# Patient Record
Sex: Female | Born: 1975 | Race: White | Hispanic: No | Marital: Married | Smoking: Former smoker
Health system: Southern US, Community
[De-identification: ages and names within clinical notes are randomized; demographics above are authoritative.]

## PROBLEM LIST (undated history)

## (undated) DIAGNOSIS — O139 Gestational [pregnancy-induced] hypertension without significant proteinuria, unspecified trimester: Secondary | ICD-10-CM

## (undated) DIAGNOSIS — E669 Obesity, unspecified: Secondary | ICD-10-CM

## (undated) DIAGNOSIS — N281 Cyst of kidney, acquired: Secondary | ICD-10-CM

## (undated) DIAGNOSIS — N200 Calculus of kidney: Secondary | ICD-10-CM

## (undated) DIAGNOSIS — K8020 Calculus of gallbladder without cholecystitis without obstruction: Secondary | ICD-10-CM

## (undated) DIAGNOSIS — E282 Polycystic ovarian syndrome: Secondary | ICD-10-CM

## (undated) HISTORY — DX: Calculus of gallbladder without cholecystitis without obstruction: K80.20

## (undated) HISTORY — DX: Obesity, unspecified: E66.9

## (undated) HISTORY — DX: Cyst of kidney, acquired: N28.1

## (undated) HISTORY — DX: Polycystic ovarian syndrome: E28.2

## (undated) HISTORY — DX: Gestational (pregnancy-induced) hypertension without significant proteinuria, unspecified trimester: O13.9

## (undated) HISTORY — PX: WISDOM TOOTH EXTRACTION: SHX21

## (undated) HISTORY — DX: Calculus of kidney: N20.0

---

## 2000-01-03 HISTORY — PX: INCISION AND DRAINAGE PERIRECTAL ABSCESS: SHX1804

## 2006-01-14 ENCOUNTER — Emergency Department: Payer: Self-pay | Admitting: Emergency Medicine

## 2007-12-06 ENCOUNTER — Observation Stay: Payer: Self-pay

## 2008-01-08 ENCOUNTER — Inpatient Hospital Stay: Payer: Self-pay | Admitting: Obstetrics & Gynecology

## 2008-08-27 ENCOUNTER — Ambulatory Visit: Payer: Self-pay | Admitting: Family Medicine

## 2008-08-27 DIAGNOSIS — J45909 Unspecified asthma, uncomplicated: Secondary | ICD-10-CM | POA: Insufficient documentation

## 2008-08-27 DIAGNOSIS — IMO0002 Reserved for concepts with insufficient information to code with codable children: Secondary | ICD-10-CM | POA: Insufficient documentation

## 2008-08-27 DIAGNOSIS — A630 Anogenital (venereal) warts: Secondary | ICD-10-CM | POA: Insufficient documentation

## 2008-09-02 LAB — CONVERTED CEMR LAB
AST: 29 units/L (ref 0–37)
Albumin: 4.3 g/dL (ref 3.5–5.2)
Alkaline Phosphatase: 58 units/L (ref 39–117)
CO2: 26 meq/L (ref 19–32)
FSH: 8 milliintl units/mL
Glucose, Bld: 86 mg/dL (ref 70–99)
HDL: 41.2 mg/dL (ref >39.00)
Potassium: 3.9 meq/L (ref 3.5–5.1)
Sodium: 138 meq/L (ref 135–145)
Total CHOL/HDL Ratio: 5
Total Protein: 7.8 g/dL (ref 6.0–8.3)

## 2008-09-21 ENCOUNTER — Ambulatory Visit: Payer: Self-pay | Admitting: Family Medicine

## 2008-09-21 DIAGNOSIS — J020 Streptococcal pharyngitis: Secondary | ICD-10-CM | POA: Insufficient documentation

## 2008-09-21 LAB — CONVERTED CEMR LAB: Rapid Strep: POSITIVE

## 2008-09-25 ENCOUNTER — Ambulatory Visit: Payer: Self-pay | Admitting: Family Medicine

## 2008-09-25 ENCOUNTER — Telehealth (INDEPENDENT_AMBULATORY_CARE_PROVIDER_SITE_OTHER): Payer: Self-pay | Admitting: *Deleted

## 2008-10-28 ENCOUNTER — Encounter: Payer: Self-pay | Admitting: Family Medicine

## 2009-02-01 ENCOUNTER — Telehealth (INDEPENDENT_AMBULATORY_CARE_PROVIDER_SITE_OTHER): Payer: Self-pay | Admitting: *Deleted

## 2010-02-01 NOTE — Progress Notes (Signed)
Summary: Appt rescheduled  Phone Note Outgoing Call   Call placed by: Daine Gip,  February 01, 2009 9:31 AM Call placed to: Patient Summary of Call: Called pt to reschedule appt, because of the volume of pts on Dr. Dayton Martes scheduled.  The pt became upset that we made her appt and cancelled. told pt I wanted to reschedule her appt. Pt did not want to discuss, says she would transfer her medical care.Daine Gip  February 01, 2009 9:37 AM fyi to [place].Daine Gip  February 01, 2009 9:37 AM  Initial call taken by: Daine Gip,  February 01, 2009 9:37 AM

## 2011-03-23 ENCOUNTER — Ambulatory Visit (INDEPENDENT_AMBULATORY_CARE_PROVIDER_SITE_OTHER): Payer: 59 | Admitting: Family Medicine

## 2011-03-23 ENCOUNTER — Encounter: Payer: Self-pay | Admitting: Family Medicine

## 2011-03-23 VITALS — BP 130/82 | HR 72 | Ht 61.0 in | Wt 258.0 lb

## 2011-03-23 DIAGNOSIS — J45909 Unspecified asthma, uncomplicated: Secondary | ICD-10-CM

## 2011-03-23 DIAGNOSIS — Z0000 Encounter for general adult medical examination without abnormal findings: Secondary | ICD-10-CM

## 2011-03-23 DIAGNOSIS — Z23 Encounter for immunization: Secondary | ICD-10-CM

## 2011-03-23 DIAGNOSIS — J309 Allergic rhinitis, unspecified: Secondary | ICD-10-CM | POA: Insufficient documentation

## 2011-03-23 DIAGNOSIS — J45998 Other asthma: Secondary | ICD-10-CM | POA: Insufficient documentation

## 2011-03-23 LAB — POCT URINALYSIS DIPSTICK
Ketones, UA: NEGATIVE
Leukocytes, UA: NEGATIVE
Protein, UA: NEGATIVE
pH, UA: 5

## 2011-03-23 NOTE — Progress Notes (Signed)
Connie Evans is a 36 y.o. female who presents for a complete physical.  She has the following concerns:  Due for pap, but on menses now, heavy. Wants to discuss her Advair that she was given after 2 episodes of bronchitis, feels like if she doesn't use it she is having trouble breathing. Also has had increased weight gain over the past month. Not getting any exercise.  Weight is unchanged from last visit in 2010, but reports she has gained weight in the last couple of weeks.  Asthma--diagnosed age 68, but really only had an episode after moving to [place].  Has been having more problems over the last 2 years.  Was sick in January, treated with 2 courses of antibiotics and steroids.  Has been on Advair ever since.  Takes it just once a day, but if she misses it, she has recurrent shortness of breath.  Has albuterol inhaler, but hasn't needed it.  Immunization History  Administered Date(s) Administered  . Influenza Split 10/03/2010  . Tdap 11/12/2008   Last Pap smear: postpartum check in 2010 Last mammogram: never Last colonoscopy: never Last DEXA: never Dentist: past due, 2009 Ophtho: recent Exercise: none Lipids 08/2008 TChol 194, LDL 129, HDL 41, TG 117, chol/HDL ratio 5.  Past Medical History  Diagnosis Date  . Asthma     1 episode age 44 when moved to [place], but problems since 2010  . Obesity   . PIH (pregnancy induced hypertension)     Past Surgical History  Procedure Date  . Cesarean section 2010  . Incision and drainage perirectal abscess 2002    in [place]  . Wisdom tooth extraction     History   Social History  . Marital Status: Married    Spouse Name: N/A    Number of Children: 1  . Years of Education: N/A   Occupational History  . medical billing    Social History Main Topics  . Smoking status: Former Smoker    Quit date: 07/18/2007  . Smokeless tobacco: Never Used  . Alcohol Use: No  . Drug Use: No  . Sexually Active: Yes -- Female partner(s)      Birth Control/ Protection: None   Other Topics Concern  . Not on file   Social History Narrative   Lives with husband, daughter, dog.  Husband is a diabetic    Family History  Problem Relation Age of Onset  . Cancer Mother 88    breast cancer  . Hypertension Father   . Heart disease Neg Hx   . Stroke Neg Hx   . Diabetes Neg Hx     Current outpatient prescriptions:Fluticasone-Salmeterol (ADVAIR) 250-50 MCG/DOSE AEPB, Inhale 1 puff into the lungs every 12 (twelve) hours. Only using once daily in the mornings., Disp: , Rfl: ;  PROVENTIL HFA 108 (90 BASE) MCG/ACT inhaler, Inhale 2 puffs into the lungs every 4 (four) hours as needed. , Disp: , Rfl:   No Known Allergies  ROS:  The patient denies anorexia, fever, headaches, decreased hearing, ear pain, sore throat, breast concerns, chest pain, palpitations, dizziness, syncope, cough, swelling, nausea, vomiting, diarrhea, constipation, abdominal pain, melena, hematochezia, indigestion/heartburn, hematuria, incontinence, dysuria, irregular menstrual cycles (has been irregular in the past, every 2 months, more regular recently), vaginal discharge, odor or itch, genital lesions, joint pains, numbness, tingling, weakness, tremor, suspicious skin lesions, depression, anxiety, abnormal bleeding/bruising, or enlarged lymph nodes. Rare heartburn, only with certain foods.  Sinuses have been bothering her lately.  Hasn't taken  anything for them.  PHYSICAL EXAM: BP 130/82  Pulse 72  Ht 5\' 1"  (1.549 m)  Wt 258 lb (117.028 kg)  BMI 48.75 kg/m2  LMP 03/13/2011  General Appearance:    Alert, cooperative, no distress, appears stated age  Head:    Normocephalic, without obvious abnormality, atraumatic  Eyes:    PERRL, conjunctiva/corneas clear, EOM's intact, fundi    benign  Ears:    Normal TM's and external ear canals  Nose:   Nares normal, mucosa normal, no drainage or sinus   tenderness  Throat:   Lips, mucosa, and tongue normal; teeth and gums  normal  Neck:   Supple, no lymphadenopathy;  thyroid:  no   enlargement/tenderness/nodules; no carotid   bruit or JVD  Back:    Spine nontender, no curvature, ROM normal, no CVA     tenderness  Lungs:     Clear to auscultation bilaterally without wheezes, rales or     ronchi; respirations unlabored  Chest Wall:    No tenderness or deformity   Heart:    Regular rate and rhythm, S1 and S2 normal, no murmur, rub   or gallop  Breast Exam:    Deferred--to be done with GYN exam when not on her menstrual cycle  Abdomen:     Soft, non-tender, nondistended, normoactive bowel sounds,    no masses, no hepatosplenomegaly  Genitalia:   Deferred due to menses  Rectal:    Not performed due to age<40 and no related complaints  Extremities:   No clubbing, cyanosis or edema  Pulses:   2+ and symmetric all extremities  Skin:   Skin color, texture, turgor normal, no rashes or lesions. Benign appearing nevi  Lymph nodes:   Cervical, supraclavicular, and axillary nodes normal  Neurologic:   CNII-XII intact, normal strength, sensation and gait; reflexes 2+ and symmetric throughout          Psych:   Normal mood, affect, hygiene and grooming.     ASSESSMENT/PLAN:  1. Routine general medical examination at a health care facility  POCT Urinalysis Dipstick  2. Need for pneumococcal vaccination  Pneumococcal polysaccharide vaccine 23-valent greater than or equal to 2yo subcutaneous/IM  3. Allergic rhinitis, cause unspecified    4. Asthma in remission     Allergies--start antihistamine such as claritin or Zyrtec. If allergies or asthma is not well controlled with this regimen, consider adding Singulair  Asthma--continue Advair daily.  Increase to twice daily if having any recurrent shortness of breath or wheezing, or with any illness that contributes to this.  Use albuterol as needed as a rescue inhaler, and call if/when refills are needed. Pneumovax today, flu shots yearly.  Borderline lipids in past, with low  HDL and high ratio.  LDL okay/borderline.  Encouraged low cholesterol, low fat diet, daily exercise and fish oil capsules 3000 mg daily.  Obesity--discussed need for exercise, portion control healthy diet, avoidance of empty calories (ie soda, juice).   Discussed monthly self breast exams and yearly mammograms after the age of 40--baseline recommended now due to family history in her mother; at least 30 minutes of aerobic activity at least 5 days/week; proper sunscreen use reviewed; healthy diet, including goals of calcium and vitamin D intake and alcohol recommendations (less than or equal to 1 drink/day) reviewed; regular seatbelt use; changing batteries in smoke detectors.  Immunization recommendations discussed--pneumovax today.  Colonoscopy recommendations reviewed--age 38  Return in 1-2 weeks for breast/pelvic exam. Patient not fasting today.  Recommend waiting for  labs for 2-3 months of improved diet and exercise.  Plan lipids, glucose, tsh  Recommended use of prenatal vitamin, as she is not currently using contraception.

## 2011-03-23 NOTE — Patient Instructions (Addendum)
HEALTH MAINTENANCE RECOMMENDATIONS:  It is recommended that you get at least 30 minutes of aerobic exercise at least 5 days/week (for weight loss, you may need as much as 60-90 minutes). This can be any activity that gets your heart rate up. This can be divided in 10-15 minute intervals if needed, but try and build up your endurance at least once a week.  Weight bearing exercise is also recommended twice weekly.  Eat a healthy diet with lots of vegetables, fruits and fiber.  "Colorful" foods have a lot of vitamins (ie green vegetables, tomatoes, red peppers, etc).  Limit sweet tea, regular sodas and alcoholic beverages, all of which has a lot of calories and sugar.  Up to 1 alcoholic drink daily may be beneficial for women (unless trying to lose weight, watch sugars).  Drink a lot of water.  Calcium recommendations are 1200-1500 mg daily (1500 mg for postmenopausal women or women without ovaries), and vitamin D 1000 IU daily.  This should be obtained from diet and/or supplements (vitamins), and calcium should not be taken all at once, but in divided doses.  Monthly self breast exams and yearly mammograms for women over the age of 67 is recommended.  Sunscreen of at least SPF 30 should be used on all sun-exposed parts of the skin when outside between the hours of 10 am and 4 pm (not just when at beach or pool, but even with exercise, golf, tennis, and yard work!)  Use a sunscreen that says "broad spectrum" so it covers both UVA and UVB rays, and make sure to reapply every 1-2 hours.  Remember to change the batteries in your smoke detectors when changing your clock times in the spring and fall.  Use your seat belt every time you are in a car, and please drive safely and not be distracted with cell phones and texting while driving.  Allergies--start antihistamine such as claritin or Zyrtec.  If allergies or asthma is not well controlled with this regimen, consider adding Singulair  Asthma--continue  Advair daily.  Increase to twice daily if having any recurrent shortness of breath or wheezing, or with any illness that contributes to this.  Use albuterol as needed as a rescue inhaler, and call if/when refills are needed. Pneumovax today.  Borderline lipids in past, with low HDL and high ratio.  LDL okay/borderline.  Encouraged low cholesterol, low fat diet, daily exercise and fish oil capsules 3000 mg daily.  Please schedule dentist appointment.  We will plan on checking your sugar and cholesterol in the next 3-4 months.  We will set that up at your next visit.  Prenatal vitamin recommended

## 2011-03-30 ENCOUNTER — Encounter: Payer: Self-pay | Admitting: Internal Medicine

## 2011-04-06 ENCOUNTER — Ambulatory Visit (INDEPENDENT_AMBULATORY_CARE_PROVIDER_SITE_OTHER): Payer: 59 | Admitting: Family Medicine

## 2011-04-06 ENCOUNTER — Other Ambulatory Visit (HOSPITAL_COMMUNITY)
Admission: RE | Admit: 2011-04-06 | Discharge: 2011-04-06 | Disposition: A | Discharge status: Home / Self Care | Payer: 59 | Source: Ambulatory Visit | Attending: Family Medicine | Admitting: Family Medicine

## 2011-04-06 ENCOUNTER — Encounter: Payer: Self-pay | Admitting: Family Medicine

## 2011-04-06 VITALS — BP 124/84 | HR 68 | Ht 61.0 in | Wt 262.0 lb

## 2011-04-06 DIAGNOSIS — Z01419 Encounter for gynecological examination (general) (routine) without abnormal findings: Secondary | ICD-10-CM | POA: Insufficient documentation

## 2011-04-06 DIAGNOSIS — R5383 Other fatigue: Secondary | ICD-10-CM

## 2011-04-06 DIAGNOSIS — Z13220 Encounter for screening for lipoid disorders: Secondary | ICD-10-CM

## 2011-04-06 DIAGNOSIS — R5381 Other malaise: Secondary | ICD-10-CM

## 2011-04-06 NOTE — Patient Instructions (Signed)
Remember to start prenatal vitamin daily Remember to get 1200 mg of calcium and 1000 IU of Vitamin D daily, either through vitamins/supplements of diet.  Remember to schedule your baseline mammogram  Schedule your labs for 3 months.  Try and get on a healthier routine as far as diet and exercise by then.

## 2011-04-06 NOTE — Progress Notes (Signed)
Patient presents for breast and pelvic exam.  Denies any specific complaints or concerns.  Period lasted longer than usual this past time. Denies vaginal discharge, odor, itch or risks for STD's.  In a monogamous relationship with her husband.  Hasn't started prenatal vitamins yet; allergies and asthma have been doing well.  Past Medical History  Diagnosis Date  . Asthma     1 episode age 36 when moved to [place], but problems since 2010  . Obesity   . PIH (pregnancy induced hypertension)     Past Surgical History  Procedure Date  . Cesarean section 2010  . Incision and drainage perirectal abscess 2002    in [place]  . Wisdom tooth extraction     History   Social History  . Marital Status: Married    Spouse Name: N/A    Number of Children: 1  . Years of Education: N/A   Occupational History  . medical billing    Social History Main Topics  . Smoking status: Former Smoker    Quit date: 07/18/2007  . Smokeless tobacco: Never Used  . Alcohol Use: No  . Drug Use: No  . Sexually Active: Yes -- Female partner(s)    Birth Control/ Protection: None   Other Topics Concern  . Not on file   Social History Narrative   Lives with husband, daughter, dog.  Husband is a diabetic    Family History  Problem Relation Age of Onset  . Cancer Mother 72    breast cancer  . Hypertension Father   . Heart disease Neg Hx   . Stroke Neg Hx   . Diabetes Neg Hx     Current outpatient prescriptions:Fluticasone-Salmeterol (ADVAIR) 250-50 MCG/DOSE AEPB, Inhale 1 puff into the lungs every 12 (twelve) hours. Only using once daily in the mornings., Disp: , Rfl: ;  PROVENTIL HFA 108 (90 BASE) MCG/ACT inhaler, Inhale 2 puffs into the lungs every 4 (four) hours as needed. , Disp: , Rfl:   No Known Allergies  ROS:  Denies fevers, URI symptoms.  Allergies and asthma doing well.  Denies urinary complaints, vaginal discharge, skin rashes or other concerns.  BP 124/84  Pulse 68  Ht 5\' 1"  (1.549  m)  Wt 262 lb (118.842 kg)  BMI 49.50 kg/m2  LMP 03/13/2011 Well developed, morbidly obese female in no distress Breast exam: no skin dimpling, nipple discharge, nipple inversion, breast masses or axillary lymphadenopathy GYN exam:  External genitalia normal without lesions.  BUS and vagina normal, some clear-white discharge present, without odor.  Cervix is normal without lesions.  No cervical motion tenderness.  No adnexal masses or uterine masses, although exam is somewhat limited by body habitus.  Pap was obtained.  ASSESSMENT/PLAN: 1. Fatigue  TSH, Vitamin D 25 hydroxy, Comprehensive metabolic panel, CBC with Differential  2. Screening for lipoid disorders  Lipid panel  3. Well female exam with routine gynecological exam  Cytology - PAP   Recommended use of prenatal vitamin, as she is not currently using contraception.  Schedule mammogram as previously recommended  Return for fasting labs in 3 months, when on better died, and getting more regular exercise as recommended.

## 2011-04-10 ENCOUNTER — Encounter: Payer: Self-pay | Admitting: Family Medicine

## 2011-07-03 ENCOUNTER — Other Ambulatory Visit: Payer: 59

## 2011-07-03 DIAGNOSIS — R5383 Other fatigue: Secondary | ICD-10-CM

## 2011-07-03 DIAGNOSIS — Z13220 Encounter for screening for lipoid disorders: Secondary | ICD-10-CM

## 2011-07-03 LAB — CBC WITH DIFFERENTIAL/PLATELET
Eosinophils Absolute: 0.2 10*3/uL (ref 0.0–0.7)
Hemoglobin: 13.8 g/dL (ref 12.0–15.0)
Lymphocytes Relative: 32 % (ref 12–46)
Lymphs Abs: 1.8 10*3/uL (ref 0.7–4.0)
Monocytes Relative: 8 % (ref 3–12)
Neutro Abs: 3.1 10*3/uL (ref 1.7–7.7)
Neutrophils Relative %: 55 % (ref 43–77)
Platelets: 287 10*3/uL (ref 150–400)
RBC: 4.77 MIL/uL (ref 3.87–5.11)
WBC: 5.7 10*3/uL (ref 4.0–10.5)

## 2011-07-04 ENCOUNTER — Encounter: Payer: Self-pay | Admitting: Family Medicine

## 2011-07-04 DIAGNOSIS — E559 Vitamin D deficiency, unspecified: Secondary | ICD-10-CM | POA: Insufficient documentation

## 2011-07-04 LAB — LIPID PANEL
Cholesterol: 195 mg/dL (ref 0–200)
HDL: 49 mg/dL (ref >39)
LDL Cholesterol: 125 mg/dL — ABNORMAL HIGH (ref 0–99)
Triglycerides: 107 mg/dL (ref <150)

## 2011-07-04 LAB — COMPREHENSIVE METABOLIC PANEL
Albumin: 4.3 g/dL (ref 3.5–5.2)
Alkaline Phosphatase: 62 U/L (ref 39–117)
BUN: 10 mg/dL (ref 6–23)
CO2: 24 mEq/L (ref 19–32)
Calcium: 8.9 mg/dL (ref 8.4–10.5)
Chloride: 103 mEq/L (ref 96–112)
Glucose, Bld: 96 mg/dL (ref 70–99)
Potassium: 4.2 mEq/L (ref 3.5–5.3)
Sodium: 140 mEq/L (ref 135–145)
Total Protein: 6.9 g/dL (ref 6.0–8.3)

## 2011-07-05 ENCOUNTER — Other Ambulatory Visit: Payer: Self-pay | Admitting: *Deleted

## 2011-07-05 DIAGNOSIS — E559 Vitamin D deficiency, unspecified: Secondary | ICD-10-CM

## 2011-07-05 MED ORDER — ERGOCALCIFEROL 1.25 MG (50000 UT) PO CAPS: 50000.0000 [IU] | ORAL_CAPSULE | ORAL | Status: DC

## 2012-03-15 ENCOUNTER — Ambulatory Visit: Payer: 59 | Admitting: Family Medicine

## 2012-03-15 ENCOUNTER — Encounter: Payer: Self-pay | Admitting: Family Medicine

## 2012-03-15 VITALS — BP 122/88 | HR 93 | Temp 98.2°F | Wt 288.0 lb

## 2012-03-15 DIAGNOSIS — J019 Acute sinusitis, unspecified: Secondary | ICD-10-CM

## 2012-03-15 MED ORDER — AMOXICILLIN 875 MG PO TABS: 875.0000 mg | ORAL_TABLET | Freq: Two times a day (BID) | ORAL | Status: DC

## 2012-03-15 NOTE — Patient Instructions (Signed)

## 2012-03-15 NOTE — Progress Notes (Signed)
  Subjective:    Patient ID: Connie Evans, female    DOB: [birth year], 37 y.o.   MRN: [id number]  HPI She has a ten-day history of sinus congestion, rhinorrhea and PND. She then developed a cough that has become productive. She also notes upper tooth discomfort with the PND becoming more purulent. She notes a popping sensation in her right ear. No fever or chills. She does not smoke and has no allergies.   Review of Systems     Objective:   Physical Exam alert and in no distress. Tympanic membranes and canals are normal. Throat is clear. Tonsils are normal. Neck is supple without adenopathy or thyromegaly. Cardiac exam shows a regular sinus rhythm without murmurs or gallops. Lungs are clear to auscultation. Nasal mucosa is slightly erythematous. Tender to palpation over frontal and maxillary sinuses       Assessment & Plan:  Acute sinusitis - Plan: amoxicillin (AMOXIL) 875 MG tablet  She is to call if not entirely better when she finishes the antibiotic

## 2012-03-21 ENCOUNTER — Telehealth: Payer: Self-pay | Admitting: Family Medicine

## 2012-03-21 NOTE — Telephone Encounter (Signed)
If she's using the inhaler and not getting good response, have her set up an appointment with [facility] or with me next week.

## 2012-03-22 NOTE — Telephone Encounter (Signed)
I left a message on the patients voicemail. CLS 

## 2012-03-28 ENCOUNTER — Encounter: Payer: Self-pay | Admitting: Family Medicine

## 2012-03-28 ENCOUNTER — Ambulatory Visit (INDEPENDENT_AMBULATORY_CARE_PROVIDER_SITE_OTHER): Payer: 59 | Admitting: Family Medicine

## 2012-03-28 VITALS — BP 130/84 | HR 84 | Temp 97.6°F | Ht 61.0 in | Wt 290.0 lb

## 2012-03-28 DIAGNOSIS — J45901 Unspecified asthma with (acute) exacerbation: Secondary | ICD-10-CM

## 2012-03-28 DIAGNOSIS — R059 Cough, unspecified: Secondary | ICD-10-CM

## 2012-03-28 DIAGNOSIS — R05 Cough: Secondary | ICD-10-CM

## 2012-03-28 DIAGNOSIS — J209 Acute bronchitis, unspecified: Secondary | ICD-10-CM

## 2012-03-28 MED ORDER — MOMETASONE FURO-FORMOTEROL FUM 200-5 MCG/ACT IN AERO: 2.0000 | INHALATION_SPRAY | Freq: Two times a day (BID) | RESPIRATORY_TRACT | Status: DC

## 2012-03-28 MED ORDER — SODIUM CHLORIDE 0.9 % IV SOLN
125.0000 mg | Freq: Once | INTRAVENOUS | Status: AC
  Administered 2012-03-28: 130 mg via INTRAMUSCULAR

## 2012-03-28 MED ORDER — AZITHROMYCIN 250 MG PO TABS: ORAL_TABLET | ORAL | Status: DC

## 2012-03-28 MED ORDER — PREDNISONE 20 MG PO TABS: 20.0000 mg | ORAL_TABLET | Freq: Two times a day (BID) | ORAL | Status: DC

## 2012-03-28 MED ORDER — ALBUTEROL SULFATE HFA 108 (90 BASE) MCG/ACT IN AERS: 2.0000 | INHALATION_SPRAY | RESPIRATORY_TRACT | Status: DC | PRN

## 2012-03-28 NOTE — Progress Notes (Signed)
Chief Complaint  Patient presents with  . Cough    seen last week by Dr.Lalonde for sinus infection, given amoxil 875, finished abx. Never really got better just moved into her chest, cough is really bad now and mucus is white/green.   Saw Dr. Susann Givens about 2 weeks ago for sinus infection.  Treated with amoxacillin and the sinuses improved.  Now complaining of chest congestion, tightness, shortness of breath and wheezing.  Started with these symptoms about a week ago, and is getting worse.  Having shortness of breath with minimal exertion.  Using albuterol every 3.5-4 hours, and not working very well.  Cough is productive of creamy, slightly yellow phlegm, with some green chunks mixed in (green started yesterday).  She hasn't been using any OTC meds.  She got hives from a liquid mucinex in February, has tolerated Robitussin in past.  Has been off of her Advair for almost a year, because had been doing well, not needing inhaler.  Recently started it back up, but only had a few doses.  Past Medical History  Diagnosis Date  . Asthma     1 episode age 37 when moved to [place], but problems since 2010  . Obesity   . PIH (pregnancy induced hypertension)    Past Surgical History  Procedure Laterality Date  . Cesarean section  2010  . Incision and drainage perirectal abscess  2002    in [place]  . Wisdom tooth extraction     History   Social History  . Marital Status: Married    Spouse Name: N/A    Number of Children: 1  . Years of Education: N/A   Occupational History  . medical billing    Social History Main Topics  . Smoking status: Former Smoker    Quit date: 07/18/2007  . Smokeless tobacco: Never Used  . Alcohol Use: No  . Drug Use: No  . Sexually Active: Yes -- Female partner(s)    Birth Control/ Protection: None   Other Topics Concern  . Not on file   Social History Narrative   Lives with husband, daughter, dog.  Husband is a diabetic    Current outpatient  prescriptions:Multiple Vitamins-Minerals (MULTIVITAMIN WITH MINERALS) tablet, Take 1 tablet by mouth daily., Disp: , Rfl: ;  PROVENTIL HFA 108 (90 BASE) MCG/ACT inhaler, Inhale 2 puffs into the lungs every 4 (four) hours as needed. , Disp: , Rfl: ;  Fluticasone-Salmeterol (ADVAIR) 250-50 MCG/DOSE AEPB, Inhale 1 puff into the lungs every 12 (twelve) hours. Only using once daily in the mornings., Disp: , Rfl:  ibuprofen (ADVIL,MOTRIN) 200 MG tablet, Take 400 mg by mouth every 6 (six) hours as needed for pain., Disp: , Rfl:    No Known Allergies  ROS:  Denies fever, allergy symptoms--no runny nose, itchy eyes.  Denies nausea, vomiting.  Had some diarrhea from recent antibiotics, resolved. Denies skin rashes.  Periods have been irregular.  +weight gain (about 30 pounds since 1 year ago)  PHYSICAL EXAM: BP 130/84  Pulse 84  Temp(Src) 97.6 F (36.4 C) (Oral)  Ht 5\' 1"  (1.549 m)  Wt 290 lb (131.543 kg)  BMI 54.82 kg/m2  LMP 01/17/2012 96% O2 sat prior to neb. Pleasant obese female in no distress.  Speaking in full sentences, but then having some pursed lip breathing intermittently.  +tachypnea after minimal exertion (getting on exam table) HEENT:  Conjunctiva clear.  TM's and EAC's normal. Nasal mucosa moderately edematous, no purulence.  OP clear Neck: no lymphadenopathy Heart:  regular rate, rhythm Lungs:  Diffuse wheezing with prolonged expiration.  Diminished air movement throughout. Skin: no rash  Treated with albuterol/atrovent nebulizer.  After treatment: pt felt much better.  Air movement significantly improved, but still diminished. some mild wheezing persists throughout.  No rales or ronchi.  ASSESSMENT/PLAN: Asthma with acute exacerbation - Plan: mometasone-formoterol (DULERA) 200-5 MCG/ACT AERO, albuterol (PROVENTIL HFA) 108 (90 BASE) MCG/ACT inhaler, predniSONE (DELTASONE) 20 MG tablet  Cough - Plan: PR INHAL RX, AIRWAY OBST/DX SPUTUM INDUCT, PR ALBUTEROL IPRATROP NON-COMP,  methylPREDNISolone sodium succinate (SOLU-MEDROL) 130 mg in sodium chloride 0.9 % 50 mL IVPB  Acute bronchitis - Plan: azithromycin (ZITHROMAX) 250 MG tablet  Risks/side effects of steroids, antibiotics reviewed.  Proper use of inhaled steroids reviewed, and risks/side effects reviewed, reminded to rinse mouth after use.  Solumedrol 125 given in office. rx prednisone 20mg  BID x 5 days, start tomorrow. Restart Advair today, BID---changed to [facility] due to insurance formulary Continue albuterol as needed z-pak to cover bronchitis, given ongoing discolored phlegm and cough  F/u 1 week prn. Schedule CPE

## 2012-03-28 NOTE — Patient Instructions (Signed)
Start the prednisone tomorrow. Start the [facility] today Follow up next week if not improving

## 2012-08-07 ENCOUNTER — Ambulatory Visit (INDEPENDENT_AMBULATORY_CARE_PROVIDER_SITE_OTHER): Payer: 59 | Admitting: Family Medicine

## 2012-08-07 ENCOUNTER — Encounter: Payer: Self-pay | Admitting: Family Medicine

## 2012-08-07 VITALS — BP 130/86 | HR 72 | Ht 61.0 in | Wt 291.0 lb

## 2012-08-07 DIAGNOSIS — J45998 Other asthma: Secondary | ICD-10-CM

## 2012-08-07 DIAGNOSIS — J309 Allergic rhinitis, unspecified: Secondary | ICD-10-CM

## 2012-08-07 DIAGNOSIS — R0683 Snoring: Secondary | ICD-10-CM

## 2012-08-07 DIAGNOSIS — Z0000 Encounter for general adult medical examination without abnormal findings: Secondary | ICD-10-CM

## 2012-08-07 DIAGNOSIS — R400 Somnolence: Secondary | ICD-10-CM

## 2012-08-07 DIAGNOSIS — R319 Hematuria, unspecified: Secondary | ICD-10-CM

## 2012-08-07 DIAGNOSIS — R635 Abnormal weight gain: Secondary | ICD-10-CM

## 2012-08-07 DIAGNOSIS — R0989 Other specified symptoms and signs involving the circulatory and respiratory systems: Secondary | ICD-10-CM

## 2012-08-07 DIAGNOSIS — J45909 Unspecified asthma, uncomplicated: Secondary | ICD-10-CM

## 2012-08-07 DIAGNOSIS — N926 Irregular menstruation, unspecified: Secondary | ICD-10-CM | POA: Insufficient documentation

## 2012-08-07 DIAGNOSIS — E559 Vitamin D deficiency, unspecified: Secondary | ICD-10-CM

## 2012-08-07 DIAGNOSIS — G4710 Hypersomnia, unspecified: Secondary | ICD-10-CM

## 2012-08-07 LAB — POCT URINALYSIS DIPSTICK
Bilirubin, UA: NEGATIVE
Glucose, UA: NEGATIVE
Spec Grav, UA: 1.02
Urobilinogen, UA: NEGATIVE

## 2012-08-07 LAB — POCT URINE PREGNANCY: Preg Test, Ur: NEGATIVE

## 2012-08-07 NOTE — Patient Instructions (Addendum)
HEALTH MAINTENANCE RECOMMENDATIONS:  It is recommended that you get at least 30 minutes of aerobic exercise at least 5 days/week (for weight loss, you may need as much as 60-90 minutes). This can be any activity that gets your heart rate up. This can be divided in 10-15 minute intervals if needed, but try and build up your endurance at least once a week.  Weight bearing exercise is also recommended twice weekly.  Eat a healthy diet with lots of vegetables, fruits and fiber.  "Colorful" foods have a lot of vitamins (ie green vegetables, tomatoes, red peppers, etc).  Limit sweet tea, regular sodas and alcoholic beverages, all of which has a lot of calories and sugar.  Up to 1 alcoholic drink daily may be beneficial for women (unless trying to lose weight, watch sugars).  Drink a lot of water.  Calcium recommendations are 1200-1500 mg daily (1500 mg for postmenopausal women or women without ovaries), and vitamin D 1000 IU daily.  This should be obtained from diet and/or supplements (vitamins), and calcium should not be taken all at once, but in divided doses.  Monthly self breast exams and yearly mammograms for women over the age of 2 is recommended.  Sunscreen of at least SPF 30 should be used on all sun-exposed parts of the skin when outside between the hours of 10 am and 4 pm (not just when at beach or pool, but even with exercise, golf, tennis, and yard work!)  Use a sunscreen that says "broad spectrum" so it covers both UVA and UVB rays, and make sure to reapply every 1-2 hours.  Remember to change the batteries in your smoke detectors when changing your clock times in the spring and fall.  Use your seat belt every time you are in a car, and please drive safely and not be distracted with cell phones and texting while driving.  Please start taking prenatal vitamin (the folic acid will reduce risk for neural tube defects in case you should become pregnant). We will notify you about your vitamin D  results--regardless of whether or not you are given a prescription, you will need to be taking at least 1000-2000 IU every day, longterm.  Some of this can be through your prenatal vitamin, plus a separate vitamin D.  Keep dietary journal.  Schedule routine GYN exam.

## 2012-08-07 NOTE — Progress Notes (Signed)
Chief Complaint  Patient presents with  . Annual Exam    fasting annual exam, no pap-will go back to GYN sometime this year. UA  showed 2+ blood, no symptoms but has been having some irregular periods since Feb 2014. No major concerns except for general wellness today.    Connie Evans is a 37 y.o. female who presents for a complete physical, with the following complaints, and f/u on chronic problems:  Asthma:  Hasn't been using either inhaler, because hasn't had any problems since her last visit here.  Typically wheezing is triggered by URI's and allergies, which haven't been a problem for her recently.    Vitamin D deficiency:  Diagnosed last year.  She took prescription vitamin D, but then started taking MVI, no separate vitamin D.  Stopped taking her vitamins many months ago (about 6-8).  Periods have been irregular.  Hasn't had a period since February.  Prior to that had been irregular, getting periods every 2 months or so. She did a home pregnancy test early on which was negative.  She denies any hot flashes, night sweats.  Weight gain--denies any changes in diet (if anything, she is eating better); not exercising at all.  She didn't realize how much she had gained until getting on scale here today.  Health Maintenance: Immunization History  Administered Date(s) Administered  . Influenza Split 10/03/2010  . Pneumococcal Polysaccharide 03/23/2011  . Tdap 11/12/2008  gets yearly flu shots through work Last Pap smear: 04/2011 Last mammogram: never Last colonoscopy: never Last DEXA: never Dentist: overdue, "it has been a while" maybe 6-7 years; 2009 per last note Ophtho: goes every 2 years, went last year Exercise: none Lab Results  Component Value Date   CHOL 195 07/03/2011   HDL 49 07/03/2011   LDLCALC 161* 07/03/2011   TRIG 107 07/03/2011   CHOLHDL 4.0 07/03/2011   Past Medical History  Diagnosis Date  . Asthma     1 episode age 89 when moved to [place], but problems since  2010  . Obesity   . PIH (pregnancy induced hypertension)     Past Surgical History  Procedure Laterality Date  . Cesarean section  2010  . Incision and drainage perirectal abscess  2002    in [place]  . Wisdom tooth extraction      History   Social History  . Marital Status: Married    Spouse Name: N/A    Number of Children: 1  . Years of Education: N/A   Occupational History  . medical billing    Social History Main Topics  . Smoking status: Former Smoker    Quit date: 07/18/2007  . Smokeless tobacco: Never Used  . Alcohol Use: No  . Drug Use: No  . Sexually Active: Yes -- Female partner(s)    Birth Control/ Protection: None   Other Topics Concern  . Not on file   Social History Narrative   Lives with husband, daughter, dog.  Husband is a diabetic    Family History  Problem Relation Age of Onset  . Cancer Mother 3    breast cancer  . Hypertension Father   . Heart disease Neg Hx   . Stroke Neg Hx   . Diabetes Neg Hx    She is not currently taking any medications or vitamins.  No Known Allergies  ROS: The patient denies anorexia, fever, headaches, decreased hearing, ear pain, sore throat, breast concerns, chest pain, palpitations, dizziness, syncope, cough, swelling, nausea, vomiting, diarrhea, constipation,  abdominal pain, melena, hematochezia, indigestion/heartburn, hematuria, incontinence, dysuria; denies vaginal discharge, odor or itch, genital lesions, joint pains, numbness, tingling, weakness, tremor, suspicious skin lesions, depression, anxiety, abnormal bleeding/bruising, or enlarged lymph nodes.  Rare heartburn, only with certain foods. Occasionally feet fall asleep if sitting too long Snores loudly.  +daytime somnolence, doesn't feel refreshed in mornings. About 30 pound weight gain in the last year. irregular menstrual cycles--around every 2 months, but no periods at all since February  PHYSICAL EXAM: BP 130/86  Pulse 72  Ht 5\' 1"  (1.549 m)   Wt 291 lb (131.997 kg)  BMI 55.01 kg/m2  LMP 02/08/2012  General Appearance:    Alert, cooperative, no distress, appears stated age, morbidly obese  Head:    Normocephalic, without obvious abnormality, atraumatic  Eyes:    PERRL, conjunctiva/corneas clear, EOM's intact, fundi    benign  Ears:    Normal TM's and external ear canals  Nose:   Nares normal, mucosa normal, no drainage or sinus   tenderness  Throat:   Lips, mucosa, and tongue normal; teeth and gums normal  Neck:   Supple, no lymphadenopathy;  thyroid:  no   enlargement/tenderness/nodules; no carotid   bruit or JVD  Back:    Spine nontender, no curvature, ROM normal, no CVA     tenderness  Lungs:     Clear to auscultation bilaterally without wheezes, rales or     ronchi; respirations unlabored  Chest Wall:    No tenderness or deformity   Heart:    Regular rate and rhythm, S1 and S2 normal, no murmur, rub   or gallop  Breast Exam:    Deferred to GYN  Abdomen:     Soft, non-tender, obese, nondistended, normoactive bowel sounds,    no masses, no hepatosplenomegaly  Genitalia:    Deferred to GYN     Extremities:   No clubbing, cyanosis or edema  Pulses:   2+ and symmetric all extremities  Skin:   Skin color, texture, turgor normal, no rashes or lesions  Lymph nodes:   Cervical, supraclavicular, and axillary nodes normal  Neurologic:   CNII-XII intact, normal strength, sensation and gait; reflexes 2+ and symmetric throughout          Psych:   Normal mood, affect, hygiene and grooming.    ASSESSMENT/PLAN:  Routine general medical examination at a health care facility - Plan: POCT Urinalysis Dipstick, Visual acuity screening, Glucose, random  OBESITY, MORBID - Plan: Glucose, random, Ambulatory referral to Sleep Studies  Vitamin D deficiency - Plan: Vitamin D 25 hydroxy  Asthma in remission  Allergic rhinitis, cause unspecified  Irregular menses - Plan: TSH, POCT urine pregnancy  Snoring - Plan: Ambulatory referral to  Sleep Studies  Daytime somnolence - Plan: Ambulatory referral to Sleep Studies  Hematuria - Plan: Urine culture  Weight gain  F/u with GYN for breast/pelvic exam, and for f/u irreg menses/amenorrhea, if no other cause found. Pregnancy test negative today  Start prenatal vitamins, since not using any contraception Vitamin D supplementation needed longterm.  Await lab results to see if needs add'l rx prior to restarting daily vitamin (1000 IU daily plus prenatal vitamin)  TSH--check thyroid as part of eval for weight gain and irreg menses Check glucose due to morbid obesity, as screen for DM  Refer for sleep study given h/o loud snoring, feeling unrefreshed in mornings, and +daytime somnolence.  She is at high risk given morbid obesity, thick neck.  Briefly discussed consequences of OSA, and  need for dx/treatment.  F/u in 1 month for further discussion of weight, as well as f/u labs, sleep study. Discussed need for regular exercise, healthy diet, portion control.  Discussed keeping food journal, vs using app (ie MyFitnessPal) vs Weight Watchers.  Discussed how to incorporate exercise into her routine.

## 2012-08-08 ENCOUNTER — Other Ambulatory Visit: Payer: Self-pay | Admitting: *Deleted

## 2012-08-08 LAB — VITAMIN D 25 HYDROXY (VIT D DEFICIENCY, FRACTURES): Vit D, 25-Hydroxy: 18 ng/mL — ABNORMAL LOW (ref 30–89)

## 2012-08-08 LAB — TSH: TSH: 1.594 u[IU]/mL (ref 0.350–4.500)

## 2012-08-08 MED ORDER — ERGOCALCIFEROL 1.25 MG (50000 UT) PO CAPS: 50000.0000 [IU] | ORAL_CAPSULE | ORAL | Status: DC

## 2012-08-09 LAB — URINE CULTURE: Colony Count: 50000

## 2012-08-19 ENCOUNTER — Telehealth: Payer: Self-pay | Admitting: *Deleted

## 2012-08-19 NOTE — Telephone Encounter (Signed)
Message copied by Melonie Florida on Mon Aug 19, 2012 12:10 PM ------      Message from: KNAPP, EVE      Created: Fri Aug 09, 2012  8:02 AM       Advise pt--urine culture didn't show infection.  She had some blood in urine at last visit. Let's recheck at her f/u visit next month (if not on her menses)  Please put in reminder at visit (put in visit reason?) ------

## 2012-08-19 NOTE — Telephone Encounter (Signed)
Patient advised.

## 2012-09-04 ENCOUNTER — Institutional Professional Consult (permissible substitution): Payer: 59 | Admitting: Family Medicine

## 2013-09-12 LAB — HM MAMMOGRAPHY: HM MAMMO: NEGATIVE

## 2013-09-29 ENCOUNTER — Encounter: Payer: Self-pay | Admitting: Internal Medicine

## 2013-09-29 ENCOUNTER — Encounter: Payer: Self-pay | Admitting: Family Medicine

## 2013-11-03 ENCOUNTER — Encounter: Payer: Self-pay | Admitting: Family Medicine

## 2013-12-15 ENCOUNTER — Other Ambulatory Visit: Payer: Self-pay | Admitting: Family Medicine

## 2013-12-15 NOTE — Telephone Encounter (Signed)
Yes, that's okay.

## 2013-12-15 NOTE — Telephone Encounter (Signed)
Is this okay to refill x 1 month if I can call her and get her scheduled for a med check?

## 2014-01-12 ENCOUNTER — Ambulatory Visit (INDEPENDENT_AMBULATORY_CARE_PROVIDER_SITE_OTHER): Payer: 59 | Admitting: Family Medicine

## 2014-01-12 ENCOUNTER — Encounter: Payer: Self-pay | Admitting: Family Medicine

## 2014-01-12 VITALS — BP 124/72 | HR 68 | Ht 61.0 in | Wt 302.0 lb

## 2014-01-12 DIAGNOSIS — N912 Amenorrhea, unspecified: Secondary | ICD-10-CM

## 2014-01-12 DIAGNOSIS — J45901 Unspecified asthma with (acute) exacerbation: Secondary | ICD-10-CM

## 2014-01-12 MED ORDER — MOMETASONE FURO-FORMOTEROL FUM 200-5 MCG/ACT IN AERO: INHALATION_SPRAY | RESPIRATORY_TRACT | Status: DC

## 2014-01-12 MED ORDER — ALBUTEROL SULFATE HFA 108 (90 BASE) MCG/ACT IN AERS: 2.0000 | INHALATION_SPRAY | RESPIRATORY_TRACT | Status: DC | PRN

## 2014-01-12 MED ORDER — MEDROXYPROGESTERONE ACETATE 10 MG PO TABS: 10.0000 mg | ORAL_TABLET | Freq: Every day | ORAL | Status: DC

## 2014-01-12 NOTE — Progress Notes (Addendum)
Chief Complaint  Patient presents with  . Asthma    nonfasting asthma med check.    Asthma: She has problems with season changes, when allergies flare.  Uses Dulera just as needed, admittedly not daily.  She will use it a couple of weeks 2-3 times/year (with the season changes).  Once the sinuses/allergies improve, she no longer needs it.  Elwin Sleight is very effective using it in this manner. She only takes it once daily, when she takes it, and has good results. She hasn't needed to use any albuterol. Typically wheezing is triggered by URI's and allergies.  She last filled prescription a few weeks ago; she is not currently taking. She still has some sinus issues.  She is using [employer] (aprodine).  She has not used claritin or Flonase.  Vitamin D deficiency: Diagnosed in 2013, and was low again in 08/2012 (after noncompliant with OTC vitamins). She has now been treated twice with rx vitamin D.She had biomedic screening for work and had labs done, and states that Vitamin D was low again.  She did not restart taking vitamin D after finding out it was low again.  She hasn't been on any Vit D in quite a while. Also had some other lab abnormalities--she plans to bring copies of report for my review.  She was last seen here 08/2012 for CPE.  She was referred for sleep study. She admits that she  Never went, it was expensive.  "I feel blah--I should feel motivated, happy, wanting to be healthy for my daughter".  Has no motivation or get-up and go.  She has gained 11 pounds since her last visit here. Last period was probably over a year ago. +hair growth on chin, that she shaves  Got flu shot at work  PMH, PSH, SH reviewed. Outpatient Encounter Prescriptions as of 01/12/2014  Medication Sig Note  . mometasone-formoterol (DULERA) 200-5 MCG/ACT AERO INHALE 2 PUFFS INTO THE LUNGS 2 (TWO) TIMES DAILY.   . Multiple Vitamins-Minerals (MULTIVITAMIN WITH MINERALS) tablet Take 1 tablet by mouth daily.   Marland Kitchen  triprolidine-pseudoephedrine (APRODINE) 2.5-60 MG TABS Take 1 tablet by mouth every 6 (six) hours as needed for allergies. 01/12/2014: Uses prn, at least twice daily when flaring  . [DISCONTINUED] DULERA 200-5 MCG/ACT AERO INHALE 2 PUFFS INTO THE LUNGS 2 (TWO) TIMES DAILY. 01/12/2014: Uses 2 puffs once daily, when needed, seasonally  . albuterol (PROVENTIL HFA) 108 (90 BASE) MCG/ACT inhaler Inhale 2 puffs into the lungs every 4 (four) hours as needed.   Marland Kitchen ibuprofen (ADVIL,MOTRIN) 200 MG tablet Take 400 mg by mouth every 6 (six) hours as needed for pain.   . medroxyPROGESTERone (PROVERA) 10 MG tablet Take 1 tablet (10 mg total) by mouth daily.   . [DISCONTINUED] albuterol (PROVENTIL HFA) 108 (90 BASE) MCG/ACT inhaler Inhale 2 puffs into the lungs every 4 (four) hours as needed. (Patient not taking: Reported on 01/12/2014)   . [DISCONTINUED] ergocalciferol (VITAMIN D2) 50000 UNITS capsule Take 1 capsule (50,000 Units total) by mouth once a week. (Patient not taking: Reported on 01/12/2014)    (provera rx'd at today's visit)  No Known Allergies  ROS:  +weight gain, fatigue, lacks motivation. No headaches, dizziness, chest pain, shortness of breath, polydipsia, polyuria, bleeding, bruising, rash, urinary complaints.  +amenorrhea.  See HPI  PHYSICAL EXAM: BP 124/72 mmHg  Pulse 68  Ht  (1.549 m)  Wt 302 lb (136.986 kg)  BMI 57.09 kg/m2 Morbidly obese female in no distress HEENT: PERRL, EOMI, conjunctiva  clear.  Nasal mucosa is mildly edematous, no purulence. OP is clear Neck :no lymphadenopathy, thyromegaly or mass Heart: regular rate and rhythm Lungs: clear bilaterally without wheeze, rales, ronchi.  Some distant breath sounds throughout. Abdomen: obese, soft, nontender, no organomegaly or mass Extremities: no significant edema Skin: no rash Psych: normal mood, affect, hygiene and grooming Neuro: alert and oriented. Normal cranial nerves, gait  ASSESSMENT/PLAN:  Asthma with acute  exacerbation, unspecified asthma severity - Plan: mometasone-formoterol (DULERA) 200-5 MCG/ACT AERO, albuterol (PROVENTIL HFA) 108 (90 BASE) MCG/ACT inhaler  Amenorrhea - Plan: medroxyPROGESTERone (PROVERA) 10 MG tablet  Morbid obesity  Allergies and asthma:  Discussed aprodine--not lasting long enough.  Consider longer-acting medications that are simiilar such as Claritin-D or Zyrtec-D Also discussed Flonase. Proper use reviewed in detail If ineffective, consider singulair (treats both allergies and asthma) Discussed proper dosing of Dulera (BID vs QD); doing well on current regimen  Suspect PCOS.  Consider metformin and OCP's.  To discuss at subsequent appt.  Provera 10mg  x 10 days.  Pt to call if no withdrawal bleed.  Vitamin D deficiency--likely recurrent given noncompliance with supplements. Reviewed risks/consequences and symptoms. Pt to bring in copies of her labs. may need add'l rx. Obesity--doesn't seem very motivated to lose weight at this time. We discussed possible depression--she thought since she didn't feel like she wanted to jump off a bridge, she didn't think she was depressed.  We discussed other symptoms in detail. Encouraged her to see therapist--look into EAP. Discussed ways to stay motivated, staying accountable through plan/routine with spouse, friend.  [employer].  Snoring and probable sleep apnea--pt to check with insurance re: cost/coverage for sleep studies, home vs at sleep lab. Weight loss strongly encouraged.  Risks of obesity reveiwed. Might benefit from OCP's and metformin to help with weight loss if found to have PCOS component.   Set up for CPE, May need sooner appointment based on labs she will be dropping off, or if no withdrawal bleed after provera  50 minute visit, more than 1/2 spent counseling

## 2014-01-12 NOTE — Patient Instructions (Addendum)
Take provera once daily for 10 days.  You should expect a period after finishing the course.  Call us if it has been over a week since stopping, and no bleeding occurs. There is a refill on the Provera--this is to be used if you go another 3 months without having a natural period.  Consider changing to Claritin-D or Zyrtec-D in place of what you are using now--these are once daily, so easier.  YOu may also need to use Flonase along with it.  2 sprays into each nostril every day (gentle sniffs). We mayneed to consider singulair in the future to treat both allergies and asthma, but not needed if you can get allergies and asthma well controlled with the [facility] and claritin/flonase combination.  It is important to take Vitamin D 2000 IU daily.  You likely will need additional prescription to get it up faster, but taking 2000 daily will help it gradually improve.  It is important to get sleep study so that if there is sleep apnea, it can be treated in order to prevent long-term complication to the heart.  Weight loss is strongly encouraged.  If PCOS is diagnosed, then metformin (along with birth control pills) may help regulate the hormones (periods, hair growth) and might actually help with some weight loss. Healthy food choices, portion control and daily exercise are of course also important in weight loss.  Consider talking to a therapist/counselor re: lack of motivation, possible depression.  See if your job has employee assistance program.

## 2014-02-09 ENCOUNTER — Encounter: Payer: Self-pay | Admitting: Family Medicine

## 2014-02-09 ENCOUNTER — Ambulatory Visit (INDEPENDENT_AMBULATORY_CARE_PROVIDER_SITE_OTHER): Payer: 59 | Admitting: Family Medicine

## 2014-02-09 VITALS — BP 120/60 | HR 72 | Ht 61.0 in | Wt 294.0 lb

## 2014-02-09 DIAGNOSIS — J309 Allergic rhinitis, unspecified: Secondary | ICD-10-CM

## 2014-02-09 DIAGNOSIS — Z0000 Encounter for general adult medical examination without abnormal findings: Secondary | ICD-10-CM

## 2014-02-09 DIAGNOSIS — R7989 Other specified abnormal findings of blood chemistry: Secondary | ICD-10-CM

## 2014-02-09 DIAGNOSIS — E7800 Pure hypercholesterolemia, unspecified: Secondary | ICD-10-CM

## 2014-02-09 DIAGNOSIS — R3129 Other microscopic hematuria: Secondary | ICD-10-CM

## 2014-02-09 DIAGNOSIS — J45998 Other asthma: Secondary | ICD-10-CM

## 2014-02-09 DIAGNOSIS — E559 Vitamin D deficiency, unspecified: Secondary | ICD-10-CM

## 2014-02-09 DIAGNOSIS — N911 Secondary amenorrhea: Secondary | ICD-10-CM | POA: Insufficient documentation

## 2014-02-09 DIAGNOSIS — R312 Other microscopic hematuria: Secondary | ICD-10-CM

## 2014-02-09 DIAGNOSIS — R945 Abnormal results of liver function studies: Secondary | ICD-10-CM

## 2014-02-09 LAB — POCT URINALYSIS DIPSTICK
BILIRUBIN UA: NEGATIVE
Glucose, UA: NEGATIVE
Ketones, UA: NEGATIVE
Leukocytes, UA: NEGATIVE
Nitrite, UA: NEGATIVE
PH UA: 6
Protein, UA: NEGATIVE
UROBILINOGEN UA: NEGATIVE

## 2014-02-09 NOTE — Progress Notes (Signed)
Chief Complaint  Patient presents with  . Annual Exam    nonfasting annual exam without pap-sees Dr.Rosenow [facility]([place]) and will schedule appt soon. Sees Dr.McFarland for optho less than 2 months so we did not do eye exam. Does have a mole on the left side of her face that she would like you to look at.    Connie Evans is a 39 y.o. female who presents for a complete physical.  She has the following concerns:  She brings in labs done from work 11/15/12--She had labs more recently (11/2103), but must have printed the wrong year.  The labs are as follows (from 2014): Tchol 229, TG 134, LDL 148, HDL 54, chol/HDL 4.2. Cardio CRP elevated at 6.8. Fasting glu 87, A1c 5.9. Cr and GFR normal; ALT/AST elevated at 50/37.  Other LFT's were normal Normal TSH, CBC Vit D-OH was low at 18 Uric acid was elevated at 8.4  Asthma: She has problems with season changes, when allergies flare. Uses Dulera just as needed, admittedly not daily. She will use it a couple of weeks 2-3 times/year (with the season changes). Once the sinuses/allergies improve, she no longer needs it. Elwin SleightDulera is very effective using it in this manner. She only takes it once daily, when she takes it, and has good results. She hasn't needed to use any albuterol. Typically wheezing is triggered by URI's and allergies. She still has some sinus issues. She is using Wal-Act (aprodine). At recent visit we discussed longer-acting medications (Claritin-D, Zyrtec-D) and Flonase. She hasn't been having recently, so hasn't made changes yet.  Vitamin D deficiency: Diagnosed in 2013, and was low again in 08/2012 (after noncompliant with OTC vitamins), and again noted 11/2012 labs.  11/2013 labs not available. She started back on OTC Vitamin D (takes 2/day, unsure of dose), and also added back a gummy MVI.  She has only been taking these two supplements for the last 1-2 weeks.  She was last seen here 08/2012 for CPE. She was referred for sleep  study. She never set it up because it was expensive. She was referred again after her recent visit, but has not yet scheduled it (but she reports that she plans to and will).  "I feel blah--I should feel motivated, happy, wanting to be healthy for my daughter" reported at her recent visit. Had no motivation or get-up and go. We had discussed depression, and encouraged her to consider counseling. She hasn't done this, and isn't ready to consider this yet. She feels like she is a little more positive now, better.  Amenorrhea.  Suspect PCOS.  She was prescribed Provera at her visit last month, but she hasn't taken it yet (she filled it, didn't take it).  She states that her last period was probably over a year ago. She intermittently feels like she is going to start period, but she hasn't seen any bleeding/spotting. +hair growth on chin, that she shaves She admits that she hasn't seen her GYN in a few years, plans to schedule  Immunization History  Administered Date(s) Administered  . Influenza Split 10/03/2010, 10/02/2013  . Pneumococcal Polysaccharide-23 03/23/2011  . Tdap 11/12/2008   Last Pap smear: 04/2011; hasn't had pap since. She reports she saw GYN last in 2013, and plans to schedule Last mammogram: 09/2013 Last colonoscopy: never Last DEXA: never Dentist: had some teeth pulled last year; past due for cleaning. Ophtho: goes every 2 years, went recently Exercise: none (just walking from car to work, and when shopping).  Plans  to start walking with a friend from work.  Past Medical History  Diagnosis Date  . Asthma     1 episode age 24 when moved to [place], but problems since 2010  . Obesity   . PIH (pregnancy induced hypertension)     Past Surgical History  Procedure Laterality Date  . Cesarean section  2010  . Incision and drainage perirectal abscess  2002    in [place]  . Wisdom tooth extraction      History   Social History  . Marital Status: Married    Spouse  Name: N/A    Number of Children: 1  . Years of Education: N/A   Occupational History  . medical billing    Social History Main Topics  . Smoking status: Former Smoker    Quit date: 07/18/2007  . Smokeless tobacco: Never Used  . Alcohol Use: No  . Drug Use: No  . Sexual Activity:    Partners: Male    Birth Control/ Protection: Condom   Other Topics Concern  . Not on file   Social History Narrative   Lives with husband, daughter, dog.  Husband is a diabetic    Family History  Problem Relation Age of Onset  . Cancer Mother 46    breast cancer  . Hypertension Father   . Heart disease Neg Hx   . Stroke Neg Hx   . Diabetes Neg Hx     Outpatient Encounter Prescriptions as of 02/09/2014  Medication Sig Note  . cholecalciferol (VITAMIN D) 1000 UNITS tablet Take 1,000 Units by mouth daily. 02/09/2014: Unsure of dose. Taking 2/day, will call with dose  . mometasone-formoterol (DULERA) 200-5 MCG/ACT AERO INHALE 2 PUFFS INTO THE LUNGS 2 (TWO) TIMES DAILY.   . Multiple Vitamins-Minerals (MULTIVITAMIN WITH MINERALS) tablet Take 1 tablet by mouth daily.   Marland Kitchen albuterol (PROVENTIL HFA) 108 (90 BASE) MCG/ACT inhaler Inhale 2 puffs into the lungs every 4 (four) hours as needed. (Patient not taking: Reported on 02/09/2014)   . ibuprofen (ADVIL,MOTRIN) 200 MG tablet Take 400 mg by mouth every 6 (six) hours as needed for pain.   . medroxyPROGESTERone (PROVERA) 10 MG tablet Take 1 tablet (10 mg total) by mouth daily. (Patient not taking: Reported on 02/09/2014)   . [DISCONTINUED] triprolidine-pseudoephedrine (APRODINE) 2.5-60 MG TABS Take 1 tablet by mouth every 6 (six) hours as needed for allergies. 01/12/2014: Uses prn, at least twice daily when flaring    No Known Allergies  ROS: The patient denies anorexia, fever, headaches, decreased hearing, ear pain, sore throat, breast concerns, chest pain, palpitations, dizziness, syncope, cough, swelling, nausea, vomiting, diarrhea, constipation, abdominal  pain, melena, hematochezia, indigestion/heartburn, hematuria, incontinence, dysuria; denies vaginal discharge, odor or itch, genital lesions, joint pains, numbness, tingling, weakness, tremor, suspicious skin lesions, depression, anxiety, abnormal bleeding/bruising, or enlarged lymph nodes.  Rare heartburn, only with certain foods (which she tries to avoid) Occasionally feet fall asleep if sitting too long Snores loudly. +daytime somnolence, doesn't feel refreshed in mornings. She hasn't had a period in about a year.  Didn't take the Provera that was recently prescribed. Currently no URI or allergy symptoms, asthma well controlled. Denies feeling down, just fatigued.  Moods have been a little more positive than at last visit. She lost 8# since her recent visit.  No known changes in diet or activity, other than just drinking more water.  PHYSICAL EXAM:  BP 138/78 mmHg  Pulse 72  Ht  (1.549 m)  Wt 294 lb (  133.358 kg)  BMI 55.58 kg/m2  General Appearance:   Alert, cooperative, no distress, appears stated age, morbidly obese  Head:   Normocephalic, without obvious abnormality, atraumatic  Eyes:   PERRL, conjunctiva/corneas clear, EOM's intact, fundi   benign  Ears:   Normal TM's and external ear canals  Nose:  Nares normal, mucosa normal, no drainage or sinus tenderness  Throat:  Lips, mucosa, and tongue normal; teeth and gums normal  Neck:  Supple, no lymphadenopathy; thyroid: no enlargement/tenderness/nodules; no carotid  bruit or JVD  Back:  Spine nontender, no curvature, ROM normal, no CVA tenderness  Lungs:   Clear to auscultation bilaterally without wheezes, rales or ronchi; respirations unlabored  Chest Wall:   No tenderness or deformity  Heart:   Regular rate and rhythm, S1 and S2 normal, no murmur, rub  or gallop  Breast Exam:   Deferred to GYN  Abdomen:   Soft, non-tender, obese, nondistended, normoactive bowel  sounds,   no masses, no hepatosplenomegaly  Genitalia:   Deferred to GYN     Extremities:  No clubbing, cyanosis or edema  Pulses:  2+ and symmetric all extremities  Skin:  Skin color, texture, turgor normal, no rashes or lesions  Lymph nodes:  Cervical, supraclavicular, and axillary nodes normal  Neurologic:  CNII-XII intact, normal strength, sensation and gait; reflexes 2+ and symmetric throughout   Psych: Normal mood, affect, hygiene and grooming.   Urine dip: 2+ blood, SG >1.030   ASSESSMENT/PLAN:  Annual physical exam - Plan: POCT Urinalysis Dipstick  OBESITY, MORBID - counseled re: risks, diet, exercise  Asthma in remission  Vitamin D deficiency - only recently restarted supplements. Await 11/2013 labs to review to determine if add'l Rx replacement is needed  Allergic rhinitis, unspecified allergic rhinitis type - currently stable; consider Flonase and either Claritin-D or Zyrtec-D seasonally with flares  Amenorrhea, secondary - encouraged to use the Provera rx'd at prior visit, and f/u with GYN.  Likely should be on OCP's, and possibly metformin. d/w GYN. Await recent labs - Plan: US Pelvis Complete  Pure hypercholesterolemia - per 11/2012 labs. await 11/2013 results. Diet reviewed in detail.  If recent labs abnormal, plan f/u in 3-4 months after dietary changes  Microscopic hematuria - present on all 3 of her urinalysis done in our office.  Denies any vaginal bleeding. Check u/s and refer to urology if persists - Plan: US Abdomen Complete  Elevated LFTs - noted on 11/2012 labs; awaiting review of 11/2013 labs. suspect fatty liver.  rec u/s (to eval liver, kidneys, ovaries) - Plan: US Abdomen Complete   Hematuria Amenorrhea--start Provera Prediabetes--suspect insulin resistance (related to weight, possibly related to PCOS).   Exercise, weight loss.  Diet reviewed.  Cut back on sweets,  carbs. Hyperlipidemia--diet reviewed Elevated LFTs--suspect due to fatty liver.  Awaiting more recent results  May need to set up labs for 3-4 months to fu on abnls--ie lipids, LFT's vit D, etc. (need to eval recent labs before deciding/ordering)   Discussed monthly self breast exams and yearly mammograms after the age of 37; at least 30 minutes of aerobic activity at least 5 days/week, weight-bearing exercise 2x/wk; proper sunscreen use reviewed; healthy diet, including goals of calcium and vitamin D intake and alcohol recommendations (less than or equal to 1 drink/day) reviewed; regular seatbelt use; changing batteries in smoke detectors.  Immunization recommendations discussed--UTD.  Colonoscopy recommendations reviewed--age 66.  Consider Metformin--to discuss with GYN.  May need OCP's as well

## 2014-02-09 NOTE — Patient Instructions (Addendum)
HEALTH MAINTENANCE RECOMMENDATIONS:  It is recommended that you get at least 30 minutes of aerobic exercise at least 5 days/week (for weight loss, you may need as much as 60-90 minutes). This can be any activity that gets your heart rate up. This can be divided in 10-15 minute intervals if needed, but try and build up your endurance at least once a week.  Weight bearing exercise is also recommended twice weekly.  Eat a healthy diet with lots of vegetables, fruits and fiber.  "Colorful" foods have a lot of vitamins (ie green vegetables, tomatoes, red peppers, etc).  Limit sweet tea, regular sodas and alcoholic beverages, all of which has a lot of calories and sugar.  Up to 1 alcoholic drink daily may be beneficial for women (unless trying to lose weight, watch sugars).  Drink a lot of water.  Calcium recommendations are 1200-1500 mg daily (1500 mg for postmenopausal women or women without ovaries), and vitamin D 1000 IU daily.  This should be obtained from diet and/or supplements (vitamins), and calcium should not be taken all at once, but in divided doses.  Monthly self breast exams and yearly mammograms for women over the age of 66 is recommended.  Sunscreen of at least SPF 30 should be used on all sun-exposed parts of the skin when outside between the hours of 10 am and 4 pm (not just when at beach or pool, but even with exercise, golf, tennis, and yard work!)  Use a sunscreen that says "broad spectrum" so it covers both UVA and UVB rays, and make sure to reapply every 1-2 hours.  Remember to change the batteries in your smoke detectors when changing your clock times in the spring and fall.  Use your seat belt every time you are in a car, and please drive safely and not be distracted with cell phones and texting while driving.  Fat and Cholesterol Control Diet Fat and cholesterol levels in your blood and organs are influenced by your diet. High levels of fat and cholesterol may lead to diseases  of the heart, small and large blood vessels, gallbladder, liver, and pancreas. CONTROLLING FAT AND CHOLESTEROL WITH DIET Although exercise and lifestyle factors are important, your diet is key. That is because certain foods are known to raise cholesterol and others to lower it. The goal is to balance foods for their effect on cholesterol and more importantly, to replace saturated and trans fat with other types of fat, such as monounsaturated fat, polyunsaturated fat, and omega-3 fatty acids. On average, a person should consume no more than 15 to 17 g of saturated fat daily. Saturated and trans fats are considered "bad" fats, and they will raise LDL cholesterol. Saturated fats are primarily found in animal products such as meats, butter, and cream. However, that does not mean you need to give up all your favorite foods. Today, there are good tasting, low-fat, low-cholesterol substitutes for most of the things you like to eat. Choose low-fat or nonfat alternatives. Choose round or loin cuts of red meat. These types of cuts are lowest in fat and cholesterol. Chicken (without the skin), fish, veal, and ground [place] breast are great choices. Eliminate fatty meats, such as hot dogs and salami. Even shellfish have little or no saturated fat. Have a 3 oz (85 g) portion when you eat lean meat, poultry, or fish. Trans fats are also called "partially hydrogenated oils." They are oils that have been scientifically manipulated so that they are solid at room temperature  resulting in a longer shelf life and improved taste and texture of foods in which they are added. Trans fats are found in stick margarine, some tub margarines, cookies, crackers, and baked goods.  When baking and cooking, oils are a great substitute for butter. The monounsaturated oils are especially beneficial since it is believed they lower LDL and raise HDL. The oils you should avoid entirely are saturated tropical oils, such as coconut and palm.    Remember to eat a lot from food groups that are naturally free of saturated and trans fat, including fish, fruit, vegetables, beans, grains (barley, rice, couscous, bulgur wheat), and pasta (without cream sauces).  IDENTIFYING FOODS THAT LOWER FAT AND CHOLESTEROL  Soluble fiber may lower your cholesterol. This type of fiber is found in fruits such as apples, vegetables such as broccoli, potatoes, and carrots, legumes such as beans, peas, and lentils, and grains such as barley. Foods fortified with plant sterols (phytosterol) may also lower cholesterol. You should eat at least 2 g per day of these foods for a cholesterol lowering effect.  Read package labels to identify low-saturated fats, trans fat free, and low-fat foods at the supermarket. Select cheeses that have only 2 to 3 g saturated fat per ounce. Use a heart-healthy tub margarine that is free of trans fats or partially hydrogenated oil. When buying baked goods (cookies, crackers), avoid partially hydrogenated oils. Breads and muffins should be made from whole grains (whole-wheat or whole oat flour, instead of "flour" or "enriched flour"). Buy non-creamy canned soups with reduced salt and no added fats.  FOOD PREPARATION TECHNIQUES  Never deep-fry. If you must fry, either stir-fry, which uses very little fat, or use non-stick cooking sprays. When possible, broil, bake, or roast meats, and steam vegetables. Instead of putting butter or margarine on vegetables, use lemon and herbs, applesauce, and cinnamon (for squash and sweet potatoes). Use nonfat yogurt, salsa, and low-fat dressings for salads.  LOW-SATURATED FAT / LOW-FAT FOOD SUBSTITUTES Meats / Saturated Fat (g)  Avoid: Steak, marbled (3 oz/85 g) / 11 g  Choose: Steak, lean (3 oz/85 g) / 4 g  Avoid: Hamburger (3 oz/85 g) / 7 g  Choose: Hamburger, lean (3 oz/85 g) / 5 g  Avoid: Ham (3 oz/85 g) / 6 g  Choose: Ham, lean cut (3 oz/85 g) / 2.4 g  Avoid: Chicken, with skin, dark meat (3  oz/85 g) / 4 g  Choose: Chicken, skin removed, dark meat (3 oz/85 g) / 2 g  Avoid: Chicken, with skin, light meat (3 oz/85 g) / 2.5 g  Choose: Chicken, skin removed, light meat (3 oz/85 g) / 1 g Dairy / Saturated Fat (g)  Avoid: Whole milk (1 cup) / 5 g  Choose: Low-fat milk, 2% (1 cup) / 3 g  Choose: Low-fat milk, 1% (1 cup) / 1.5 g  Choose: Skim milk (1 cup) / 0.3 g  Avoid: Hard cheese (1 oz/28 g) / 6 g  Choose: Skim milk cheese (1 oz/28 g) / 2 to 3 g  Avoid: Cottage cheese, 4% fat (1 cup) / 6.5 g  Choose: Low-fat cottage cheese, 1% fat (1 cup) / 1.5 g  Avoid: Ice cream (1 cup) / 9 g  Choose: Sherbet (1 cup) / 2.5 g  Choose: Nonfat frozen yogurt (1 cup) / 0.3 g  Choose: Frozen fruit bar / trace  Avoid: Whipped cream (1 tbs) / 3.5 g  Choose: Nondairy whipped topping (1 tbs) / 1 g Condiments /  Saturated Fat (g)  Avoid: Mayonnaise (1 tbs) / 2 g  Choose: Low-fat mayonnaise (1 tbs) / 1 g  Avoid: Butter (1 tbs) / 7 g  Choose: Extra light margarine (1 tbs) / 1 g  Avoid: Coconut oil (1 tbs) / 11.8 g  Choose: Olive oil (1 tbs) / 1.8 g  Choose: Corn oil (1 tbs) / 1.7 g  Choose: Safflower oil (1 tbs) / 1.2 g  Choose: Sunflower oil (1 tbs) / 1.4 g  Choose: Soybean oil (1 tbs) / 2.4 g  Choose: Canola oil (1 tbs) / 1 g Document Released: 12/19/2004 Document Revised: 04/15/2012 Document Reviewed: 03/19/2013 Exit[employer] Patient Information 2015 [employer]. This information is not intended to replace advice given to you by your health care provider. Make sure you discuss any questions you have with your health care provider.  Please fax your most recent (2015) lab results. Also let us know the dose of your vitamin D (IU per tablet/capsule).  Please schedule sleep study. Start the Provera and take it once daily for 10 days.  Contact us if you don't have any withdrawal bleeding the week following completing the medication. Please schedule your GYN exam Discuss  birth control pills and/or metformin with your GYN (suspect PCOS). Bring copies of your lab results to your visit with him.

## 2014-02-11 ENCOUNTER — Telehealth: Payer: Self-pay | Admitting: Family Medicine

## 2014-02-11 NOTE — Telephone Encounter (Signed)
Please advise patient that her labs from 11/2013 were reviewed.  Her cholesterol was a little lower, but HDL also dropped, so the chol/HDL ratio is actually higher than last time.  Her cardio CRP was even higher than last year (relative risk for future cardiovascular event, as we discussed at her visit). Her A1c (diabetes test) was up to 6.1 (still pre-diabetes, but heading closer towards diabetes, which is 6.5 or higher). One of her liver tests remains a little elevated (lower than last time)--I think the ultrasound I recommended is still a good idea (V--orders were put in for pelvic and abd u/s).  Her Vitamin D is even lower than last year, now down to 11. Rx 50,000 IU weekly x 12 weeks.  And have her be taking 2000 IU daily longterm once rx therapy is completed.  She likely will need additional rx therapy.  Set her up for f/u appt in 3 months (we can draw liver tests, vitamin D, A1c and any other necessary tests at that time).  In order to lower her risk for diabetes and cardiovascular events, it is very important that she start a regular exercise program, and lose weight, in addition to the dietary recommendations we discussed at visit (for cholesterol, sugar)

## 2014-02-11 NOTE — Telephone Encounter (Signed)
Left message for patient

## 2014-02-23 ENCOUNTER — Other Ambulatory Visit: Payer: Self-pay | Admitting: *Deleted

## 2014-02-23 DIAGNOSIS — N911 Secondary amenorrhea: Secondary | ICD-10-CM

## 2014-02-23 MED ORDER — ERGOCALCIFEROL 1.25 MG (50000 UT) PO CAPS: 50000.0000 [IU] | ORAL_CAPSULE | ORAL | Status: DC

## 2014-02-23 NOTE — Telephone Encounter (Signed)
Went over all results with patient. Scheduled her for 3 mo follow up with Dr.Knapp for 05/25/14. Sched[facility] for 03[facility]09/14 [facility]

## 2014-02-24 ENCOUNTER — Encounter: Payer: Self-pay | Admitting: Family Medicine

## 2014-03-03 DIAGNOSIS — N281 Cyst of kidney, acquired: Secondary | ICD-10-CM

## 2014-03-03 DIAGNOSIS — N200 Calculus of kidney: Secondary | ICD-10-CM

## 2014-03-03 HISTORY — DX: Calculus of kidney: N20.0

## 2014-03-03 HISTORY — DX: Cyst of kidney, acquired: N28.1

## 2014-03-09 ENCOUNTER — Other Ambulatory Visit: Payer: Self-pay

## 2014-03-23 ENCOUNTER — Ambulatory Visit
Admission: RE | Admit: 2014-03-23 | Discharge: 2014-03-23 | Disposition: A | Discharge status: Home / Self Care | Payer: 59 | Source: Ambulatory Visit | Attending: Family Medicine | Admitting: Family Medicine

## 2014-03-23 ENCOUNTER — Encounter: Payer: Self-pay | Admitting: Family Medicine

## 2014-03-23 DIAGNOSIS — N911 Secondary amenorrhea: Secondary | ICD-10-CM

## 2014-03-23 DIAGNOSIS — R945 Abnormal results of liver function studies: Secondary | ICD-10-CM

## 2014-03-23 DIAGNOSIS — R7989 Other specified abnormal findings of blood chemistry: Secondary | ICD-10-CM

## 2014-03-23 DIAGNOSIS — R3129 Other microscopic hematuria: Secondary | ICD-10-CM

## 2014-04-08 ENCOUNTER — Other Ambulatory Visit: Payer: Self-pay | Admitting: *Deleted

## 2014-04-08 DIAGNOSIS — N281 Cyst of kidney, acquired: Secondary | ICD-10-CM

## 2014-04-08 DIAGNOSIS — N200 Calculus of kidney: Secondary | ICD-10-CM

## 2014-05-11 ENCOUNTER — Encounter: Payer: 59 | Admitting: Family Medicine

## 2014-05-25 ENCOUNTER — Encounter: Payer: Self-pay | Admitting: Family Medicine

## 2014-05-25 ENCOUNTER — Ambulatory Visit (INDEPENDENT_AMBULATORY_CARE_PROVIDER_SITE_OTHER): Payer: 59 | Admitting: Family Medicine

## 2014-05-25 VITALS — BP 120/72 | HR 76 | Ht 61.0 in | Wt 313.0 lb

## 2014-05-25 DIAGNOSIS — N912 Amenorrhea, unspecified: Secondary | ICD-10-CM

## 2014-05-25 DIAGNOSIS — N911 Secondary amenorrhea: Secondary | ICD-10-CM

## 2014-05-25 DIAGNOSIS — R7989 Other specified abnormal findings of blood chemistry: Secondary | ICD-10-CM

## 2014-05-25 DIAGNOSIS — R7309 Other abnormal glucose: Secondary | ICD-10-CM | POA: Diagnosis not present

## 2014-05-25 DIAGNOSIS — E282 Polycystic ovarian syndrome: Secondary | ICD-10-CM | POA: Diagnosis not present

## 2014-05-25 DIAGNOSIS — R945 Abnormal results of liver function studies: Secondary | ICD-10-CM

## 2014-05-25 DIAGNOSIS — N200 Calculus of kidney: Secondary | ICD-10-CM

## 2014-05-25 DIAGNOSIS — Z6841 Body Mass Index (BMI) 40.0 and over, adult: Secondary | ICD-10-CM | POA: Diagnosis not present

## 2014-05-25 DIAGNOSIS — R7303 Prediabetes: Secondary | ICD-10-CM | POA: Insufficient documentation

## 2014-05-25 DIAGNOSIS — E559 Vitamin D deficiency, unspecified: Secondary | ICD-10-CM

## 2014-05-25 DIAGNOSIS — K8020 Calculus of gallbladder without cholecystitis without obstruction: Secondary | ICD-10-CM

## 2014-05-25 DIAGNOSIS — E7800 Pure hypercholesterolemia, unspecified: Secondary | ICD-10-CM

## 2014-05-25 LAB — HEPATIC FUNCTION PANEL
ALBUMIN: 4.3 g/dL (ref 3.5–5.2)
ALK PHOS: 67 U/L (ref 39–117)
ALT: 63 U/L — AB (ref 0–35)
AST: 42 U/L — ABNORMAL HIGH (ref 0–37)
BILIRUBIN DIRECT: 0.1 mg/dL (ref 0.0–0.3)
Indirect Bilirubin: 0.4 mg/dL (ref 0.2–1.2)
Total Bilirubin: 0.5 mg/dL (ref 0.2–1.2)
Total Protein: 7.1 g/dL (ref 6.0–8.3)

## 2014-05-25 LAB — HEMOGLOBIN A1C
HEMOGLOBIN A1C: 6.2 % — AB (ref <5.7)
Mean Plasma Glucose: 131 mg/dL — ABNORMAL HIGH (ref <117)

## 2014-05-25 MED ORDER — METFORMIN HCL 500 MG PO TABS: ORAL_TABLET | ORAL | Status: DC

## 2014-05-25 NOTE — Progress Notes (Signed)
Chief Complaint  Patient presents with  . Follow-up    3 month follow up.   After her last visit here 3 months ago, her labs from 11/2013 (done at her job) were reviewed. Her cholesterol was a little lower, but HDL also dropped, so the chol/HDL ratio is actually higher than last time. Her cardio CRP was even higher than last year (relative risk for future cardiovascular event, as we discussed at her visit). Her A1c (diabetes test) was up to 6.1.  One of her liver tests remained a little elevated (lower than last time) and so an ultrasound was ordered and done. IMPRESSION: 1. Gallstones without sonographic evidence of acute cholecystitis. The hepatic echotexture is heterogeneous without acute abnormality. 2. There is an upper pole cyst in the left kidney with mural calcification measuring 1.2 cm in diameter. In addition there are nonobstructing stones in the left kidney. The right kidney is unremarkable. Renal protocol MRI is recommended  Spoke with radiologist Dr. Martinique about abdominal u/s results. MRI is recommended to further evaluate the possibly cystic lesions (to "put it to rest" rather than doing a f/u later)  She was supposed to have MRI to further eval possible cystic lesions in kidney noted on abdominal ultrasound.  She has not yet scheduled this.  Her Vitamin D was even lower than last year, now down to 11. She took 50,000 IU weekly x 12 weeks and has been taking 2000 IU daily since rx therapy was completed.Due to be rechecked today, to see if additional rx replacement is needed.   She was advised that in order to lower her risk for diabetes and cardiovascular events, it is very important that she start a regular exercise program, and lose weight, in addition to the dietary recommendations we discussed at visit (for cholesterol, sugar).  She has gained over 10 pounds since her last visit here.  She reports she has been eating better, cut out greasy foods, eating more fruit and  leafy greens. She was unaware of her weight gain, pants haven't been tight  She has started walking 30 minutes (15 minutes twice a day, walking in parking lot at work) every other day (about 3 times/week).    She started taking a natural supplement called Plexus Slim--contains chromium, green coffee bean extract (including caffeine) and Garcinia. (powder, adds to water daily)--supposed to help with energy.  She has been using it for a month.  Amenorrhea--she accidentally threw away the prescription for the Provera, so never took them.  Still hasn't had any period.  She uses condoms regularly. Pelvic ultrasound 03/2014: IMPRESSION: Multiple, approximately 15, small follicles noted in each ovary. This finding potentially could be indicative of a degree of polycystic ovary syndrome. No dominant ovarian mass is seen. Neither ovary is enlarged  PMH, PSH, SH reviewed. Denies changes to [place]  Current Outpatient Prescriptions on File Prior to Visit  Medication Sig Dispense Refill  . cholecalciferol (VITAMIN D) 1000 UNITS tablet Take 2,000 Units by mouth daily.     . mometasone-formoterol (DULERA) 200-5 MCG/ACT AERO INHALE 2 PUFFS INTO THE LUNGS 2 (TWO) TIMES DAILY. 13 g 2  . Multiple Vitamins-Minerals (MULTIVITAMIN WITH MINERALS) tablet Take 1 tablet by mouth daily.    Marland Kitchen albuterol (PROVENTIL HFA) 108 (90 BASE) MCG/ACT inhaler Inhale 2 puffs into the lungs every 4 (four) hours as needed. (Patient not taking: Reported on 02/09/2014) 18 g 1  . ergocalciferol (VITAMIN D2) 50000 UNITS capsule Take 1 capsule (50,000 Units total) by mouth once  a week. (Patient not taking: Reported on 05/25/2014) 12 capsule 0  . ibuprofen (ADVIL,MOTRIN) 200 MG tablet Take 400 mg by mouth every 6 (six) hours as needed for pain.    . medroxyPROGESTERone (PROVERA) 10 MG tablet Take 1 tablet (10 mg total) by mouth daily. (Patient not taking: Reported on 02/09/2014) 10 tablet 1   No current facility-administered medications on file  prior to visit.   No Known Allergies  ROS:  No fevers, chills, headaches, dizziness, URI symptoms ,cough, shortness of breath, GI or GU complaints.  No vaginal discharge of bleeding. Some pain in her feet, some recently in her hips too. No numbness, tingling, weakness. No mood changes.  No hot flashes. See HPI  PHYSICAL EXAM: BP 120/72 mmHg  Pulse 76  Ht _0  (1.549 m)  Wt 313 lb (141.976 kg)  BMI 59.17 kg/m2 Pleasant, morbidly obese female in no distress Visit was limited to review of results and counseling.  She was alert and oriented, with grossly normal cranial nerves, strength, gait. Normal mood, affect, hygiene and grooming  ASSESSMENT/PLAN:  Vitamin D deficiency - additional weekly rx needed - Plan: Vit D  25 hydroxy (rtn osteoporosis monitoring), Amb ref to [facility]  Pure hypercholesterolemia - low cholesterol, lowfat diet recommended. Daily exercise. Goals reviewed. - Plan: Amb ref to [facility]  Amenorrhea, secondary - encouraged her to take the provera previously rx'd.  May need to consider OCP's  PCOS (polycystic ovarian syndrome) - Plan: Amb ref to [facility], metFORMIN (GLUCOPHAGE) 500 MG tablet  Prediabetes - likely related to PCOS, insulin resistance.  Start metformin.  Risks/side effects reviewed - Plan: Hemoglobin A1c, Amb ref to [facility], metFORMIN (GLUCOPHAGE) 500 MG tablet  Elevated LFTs - normal liver on u/s.  +gallstones present - Plan: Hepatic function panel  BMI 50.0-59.9, adult - discussed risks/complications.  refer to dietician - Plan: Amb ref to [facility]  Amenorrhea  Gallstones - reviewed lowfat diet, and s/sx of gallstones and cholecystitis  Kidney stones - on left side.  Asymptomatic.  Encouraged adequate fluid intake.  Repeat liver tests, Vit D, A1c  Okay to continue Plexus--unsure if helpful, but not likely harmful (not having effects from  caffeine).  F/u 3 months with fasting labs prior--lipid, c-met, A1c, Vitamin D-OH  Discussed diet, increasing exercise, cooking and exercising more on weekends. Consider OCP's in 3 months (depending on if periods return)  Start metformin Take provera--let us know if no withdrawal after Risks and side effects reviewed.  Please take the Provera as previously discussed--one tablet daily for 10 days.  You should have some vaginal bleeding after you finish the 10 day course.  Please let us know if you don't have ANY spotting or bleeding afterwards.  Start taking metformin once daily for a week, and then, if tolerating without too many side effects (diarrhea),  increase to twice daily.  This should help keep sugars down, might help with weight loss, and may help regulate your cycles a little bit (sometimes can start ovulation again). Be sure to continue regular condom use to prevent pregnancy.  Please schedule the kidney MRI--this is to ensure that the cystic areas on the kidney are fine (so that we don't need to otherwise follow with ultrasounds).  Make sure to drink plenty of fluids to try and avoid problems from the known kidney stones. Avoid fatty/fried foods to avoid problems from the gallstones.  We are rechecking vitamin D levels today.  We  will contact you if additional prescription is needed.  If not, then continue the 2000 IU every day.  We are referring you to the dietician to help you with your diet, food choices and weight (related to pre-diabetes, cholesterol, gall stones, etc).  We should repeat cholesterol in 3 months, after making additional changes.   This was a 40-45 min visit, more than 1/2 spent counseling

## 2014-05-25 NOTE — Patient Instructions (Addendum)
Please take the Provera as previously discussed--one tablet daily for 10 days. You should have a refill left at the pharmacy from the last prescription.  You should have some vaginal bleeding after you finish the 10 day course.  Please let us know if you don't have ANY spotting or bleeding afterwards.  Start taking metformin once daily for a week, and then, if tolerating without too many side effects (diarrhea),  increase to twice daily.  This should help keep sugars down, might help with weight loss, and may help regulate your cycles a little bit (sometimes can start ovulation again). Be sure to continue regular condom use to prevent pregnancy.  Please schedule the kidney MRI--this is to ensure that the cystic areas on the kidney are fine (so that we don't need to otherwise follow with ultrasounds).  Make sure to drink plenty of fluids to try and avoid problems from the known kidney stones. Avoid fatty/fried foods to avoid problems from the gallstones.  We are rechecking vitamin D levels today.  We will contact you if additional prescription is needed.  If not, then continue the 2000 IU every day.  We are referring you to the dietician to help you with your diet, food choices and weight (related to pre-diabetes, cholesterol, gall stones, etc).  We should repeat cholesterol in 3 months, after making additional changes.  Cholelithiasis Cholelithiasis (also called gallstones) is a form of gallbladder disease in which gallstones form in your gallbladder. The gallbladder is an organ that stores bile made in the liver, which helps digest fats. Gallstones begin as small crystals and slowly grow into stones. Gallstone pain occurs when the gallbladder spasms and a gallstone is blocking the duct. Pain can also occur when a stone passes out of the duct.  RISK FACTORS  Being female.   Having multiple pregnancies. Health care providers sometimes advise removing diseased gallbladders before future  pregnancies.   Being obese.  Eating a diet heavy in fried foods and fat.   Being older than 60 years and increasing age.   Prolonged use of medicines containing female hormones.   Having diabetes mellitus.   Rapidly losing weight.   Having a family history of gallstones (heredity).  SYMPTOMS  Nausea.   Vomiting.  Abdominal pain.   Yellowing of the skin (jaundice).   Sudden pain. It may persist from several minutes to several hours.  Fever.   Tenderness to the touch. In some cases, when gallstones do not move into the bile duct, people have no pain or symptoms. These are called "silent" gallstones.  TREATMENT Silent gallstones do not need treatment. In severe cases, emergency surgery may be required. Options for treatment include:  Surgery to remove the gallbladder. This is the most common treatment.  Medicines. These do not always work and may take 6-12 months or more to work.  Shock wave treatment (extracorporeal biliary lithotripsy). In this treatment an ultrasound machine sends shock waves to the gallbladder to break gallstones into smaller pieces that can pass into the intestines or be dissolved by medicine. HOME CARE INSTRUCTIONS   Only take over-the-counter or prescription medicines for pain, discomfort, or fever as directed by your health care provider.   Follow a low-fat diet until seen again by your health care provider. Fat causes the gallbladder to contract, which can result in pain.   Follow up with your health care provider as directed. Attacks are almost always recurrent and surgery is usually required for permanent treatment.  SEEK IMMEDIATE MEDICAL  CARE IF:   Your pain increases and is not controlled by medicines.   You have a fever or persistent symptoms for more than 2-3 days.   You have a fever and your symptoms suddenly get worse.   You have persistent nausea and vomiting.  MAKE SURE YOU:   Understand these  instructions.  Will watch your condition.  Will get help right away if you are not doing well or get worse. Document Released: 12/15/2004 Document Revised: 08/21/2012 Document Reviewed: 06/12/2012 [facility] Patient Information 2015 [place], [place]. This information is not intended to replace advice given to you by your health care provider. Make sure you discuss any questions you have with your health care provider.  Polycystic Ovarian Syndrome Polycystic ovarian syndrome (PCOS) is a common hormonal disorder among women of reproductive age. Most women with PCOS grow many small cysts on their ovaries. PCOS can cause problems with your periods and make it difficult to get pregnant. It can also cause an increased risk of miscarriage with pregnancy. If left untreated, PCOS can lead to serious health problems, such as diabetes and heart disease. CAUSES The cause of PCOS is not fully understood, but genetics may be a factor. SIGNS AND SYMPTOMS   Infrequent or no menstrual periods.   Inability to get pregnant (infertility) because of not ovulating.   Increased growth of hair on the face, chest, stomach, back, thumbs, thighs, or toes.   Acne, oily skin, or dandruff.   Pelvic pain.   Weight gain or obesity, usually carrying extra weight around the waist.   Type 2 diabetes.   High cholesterol.   High blood pressure.   Female-pattern baldness or thinning hair.   Patches of thickened and dark brown or black skin on the neck, arms, breasts, or thighs.   Tiny excess flaps of skin (skin tags) in the armpits or neck area.   Excessive snoring and having breathing stop at times while asleep (sleep apnea).   Deepening of the voice.   Gestational diabetes when pregnant.  DIAGNOSIS  There is no single test to diagnose PCOS.   Your health care provider will:   Take a medical history.   Perform a pelvic exam.   Have ultrasonography done.   Check your female and female  hormone levels.   Measure glucose or sugar levels in the blood.   Do other blood tests.   If you are producing too many female hormones, your health care provider will make sure it is from PCOS. At the physical exam, your health care provider will want to evaluate the areas of increased hair growth. Try to allow natural hair growth for a few days before the visit.   During a pelvic exam, the ovaries may be enlarged or swollen because of the increased number of small cysts. This can be seen more easily by using vaginal ultrasonography or screening to examine the ovaries and lining of the uterus (endometrium) for cysts. The uterine lining may become thicker if you have not been having a regular period.  TREATMENT  Because there is no cure for PCOS, it needs to be managed to prevent problems. Treatments are based on your symptoms. Treatment is also based on whether you want to have a baby or whether you need contraception.  Treatment may include:   Progesterone hormone to start a menstrual period.   Birth control pills to make you have regular menstrual periods.   Medicines to make you ovulate, if you want to get pregnant.  Medicines to control your insulin.   Medicine to control your blood pressure.   Medicine and diet to control your high cholesterol and triglycerides in your blood.  Medicine to reduce excessive hair growth.  Surgery, making small holes in the ovary, to decrease the amount of female hormone production. This is done through a long, lighted tube (laparoscope) placed into the pelvis through a tiny incision in the lower abdomen.  HOME CARE INSTRUCTIONS  Only take over-the-counter or prescription medicine as directed by your health care provider.  Pay attention to the foods you eat and your activity levels. This can help reduce the effects of PCOS.  Keep your weight under control.  Eat foods that are low in carbohydrate and high in fiber.  Exercise  regularly. SEEK MEDICAL CARE IF:  Your symptoms do not get better with medicine.  You have new symptoms. Document Released: 04/14/2004 Document Revised: 10/09/2012 Document Reviewed: 06/06/2012 [facility] Patient Information 2015 [place], [place]. This information is not intended to replace advice given to you by your health care provider. Make sure you discuss any questions you have with your health care provider.

## 2014-05-26 LAB — VITAMIN D 25 HYDROXY (VIT D DEFICIENCY, FRACTURES): Vit D, 25-Hydroxy: 21 ng/mL — ABNORMAL LOW (ref 30–100)

## 2014-05-29 ENCOUNTER — Other Ambulatory Visit: Payer: Self-pay | Admitting: *Deleted

## 2014-05-29 MED ORDER — ERGOCALCIFEROL 1.25 MG (50000 UT) PO CAPS: 50000.0000 [IU] | ORAL_CAPSULE | ORAL | Status: DC

## 2014-08-24 ENCOUNTER — Other Ambulatory Visit: Payer: 59

## 2014-08-27 ENCOUNTER — Encounter: Payer: Self-pay | Admitting: Family Medicine

## 2015-10-29 ENCOUNTER — Other Ambulatory Visit (INDEPENDENT_AMBULATORY_CARE_PROVIDER_SITE_OTHER): Payer: 59

## 2015-10-29 DIAGNOSIS — Z23 Encounter for immunization: Secondary | ICD-10-CM | POA: Diagnosis not present

## 2015-11-06 NOTE — Progress Notes (Signed)
Chief Complaint  Patient presents with  . Med check    nonfasting med check. Does mention that she would like to have MR reordered from 2016.    Asthma: She has h/o problems with asthma with season changes, when allergies flare. She reports not having had any problems since last December (when she was sick). Uses Dulera just as needed, and hasn't used any since December. She used to use it a couple of weeks 2-3 times/year (with the season changes), not having any problems yet so far.She hasn't needed to use any albuterol. Typically wheezing is triggered by URI's and allergies.   Vitamin D deficiency: Diagnosed in 2013, and was low again on subsequent checks.  Last checked 05/2014, after having taken a 12 week course of rx in 02/2014.  Level was 11 in 11/2013 (labs done at work, which were brought to me at her 02/2014 visit); level was  21 in 05/2014.  She has been taking a multivitamin (Thrive) daily, unsure how much Vitamin D it contains.  She looked this up while in the office: Thrive vitamins--gets total of 400 IU between the two capsules she takes.  H/o Elevated LFT's and microscopic hematuria--Ultrasound was performed 03/2014 which showed: IMPRESSION: 1. Gallstones without sonographic evidence of acute cholecystitis. The hepatic echotexture is heterogeneous without acute abnormality. 2. There is an upper pole cyst in the left kidney with mural calcification measuring 1.2 cm in diameter. In addition there are nonobstructing stones in the left kidney. The right kidney is unremarkable. Renal protocol MRI is recommended.  MRI was ordered, but she never had it done. She would like to have this re-ordered.  Morbid obesity--she lost 58# in the last year and a half since last seen here. She has done this using Thrive Supplement.  She doesn't crave as much, helps curb her appetite. He hasn't been eating healthy necessarily, but eating less.  She had been walking some (nothing recently, but plans  to restart 3-4 times/week).  She has been referred twice for sleep studies, to evaluate for sleep apnea.  Never done. She reports she is no longer snoring, and feels refreshed in the mornings.  H/o amenorrhea.  Suspect PCOS.  She was prescribed metformin in 05/2014 (A1c was 6.2). She filled it, but threw it away when she thought it was old.  She never took the prescription.  She was prescribed Provera to take every 3 months if she didn't have a menstrual cycle. She never needed to take this.  She has been having regular cycles, monthly.  +hair growth on chin, that she shaves--unchanged.  She admits that she hasn't seen her GYN in a few years, plans to schedule (she also said this at prior visit, but plans to schedule).   PMH, PSH, SH reviewed  Outpatient Encounter Prescriptions as of 11/08/2015  Medication Sig Note  . albuterol (PROVENTIL HFA) 108 (90 BASE) MCG/ACT inhaler Inhale 2 puffs into the lungs every 4 (four) hours as needed. (Patient not taking: Reported on 11/08/2015)   . metFORMIN (GLUCOPHAGE) 500 MG tablet Start at 1 tablet once daily with breakfast for a week, then increase to twice daily with meals (Patient not taking: Reported on 11/08/2015)   . mometasone-formoterol (DULERA) 200-5 MCG/ACT AERO INHALE 2 PUFFS INTO THE LUNGS 2 (TWO) TIMES DAILY. (Patient not taking: Reported on 11/08/2015)   . [DISCONTINUED] cholecalciferol (VITAMIN D) 1000 UNITS tablet Take 2,000 Units by mouth daily.  05/25/2014: .   Marland Kitchen. [DISCONTINUED] ergocalciferol (VITAMIN D2) 50000 UNITS capsule  Take 1 capsule (50,000 Units total) by mouth once a week.   . [DISCONTINUED] ibuprofen (ADVIL,MOTRIN) 200 MG tablet Take 400 mg by mouth every 6 (six) hours as needed for pain.   . [DISCONTINUED] medroxyPROGESTERone (PROVERA) 10 MG tablet Take 1 tablet (10 mg total) by mouth daily. (Patient not taking: Reported on 02/09/2014) 05/25/2014: Never started (accidentally threw it away)  . [DISCONTINUED] Multiple Vitamins-Minerals  (MULTIVITAMIN WITH MINERALS) tablet Take 1 tablet by mouth daily.    No facility-administered encounter medications on file as of 11/08/2015.    No Known Allergies  ROS:  No fever, chills, headaches, dizziness, URI symptoms, cough, shortness of breath, chest pain, GI complaints, skin concerns.  Normal menses.      PHYSICAL EXAM:  BP 124/70 (BP Location: Left Arm, Patient Position: Sitting, Cuff Size: Normal)   Pulse 72   Ht 5\' 1"  (1.549 m)   Wt 255 lb (115.7 kg)   LMP 10/08/2015 (Exact Date)   BMI 48.18 kg/m   Obese, pleasant female in no distress HEENT: PERRL, EOMI, conjunctiva and sclera are clear.  OP is clear Neck: no lymphadenopathy, thyromegaly or mass Heart: regular rate and rhythm without murmur Lungs: clear bilaterally Abdomen: obese, soft, nontender, no mass Extremities: no edema Skin: normal turgor, no rash Psych: normal mood, affect, hygiene and grooming  Lab Results  Component Value Date   HGBA1C 5.4 11/08/2015   ASSESSMENT/PLAN:  Prediabetes - resolved with weight loss - Plan: HgB A1c  PCOS (polycystic ovarian syndrome) - improved; never started the metformin.  A1c now normal, and regular cycles  Vitamin D deficiency - check level; currently only getting 400 IU daily  Amenorrhea, secondary - resolved  Pure hypercholesterolemia - (mild); last check 2013--to return for fasting labs  Body mass index (BMI) 45.0-49.9, adult (HCC)  Elevated LFTs  Gallstones - asymptomatic  Uncomplicated asthma, unspecified asthma severity, unspecified whether persistent - Plan: Spirometry with Graph    Spirometry--normal  MRI renal protocol--order  Pt plans to start regular walking program and to schedule routine GYN visit.

## 2015-11-08 ENCOUNTER — Ambulatory Visit (INDEPENDENT_AMBULATORY_CARE_PROVIDER_SITE_OTHER): Payer: 59 | Admitting: Family Medicine

## 2015-11-08 ENCOUNTER — Encounter: Payer: Self-pay | Admitting: Family Medicine

## 2015-11-08 VITALS — BP 124/70 | HR 72 | Ht 61.0 in | Wt 255.0 lb

## 2015-11-08 DIAGNOSIS — Z6841 Body Mass Index (BMI) 40.0 and over, adult: Secondary | ICD-10-CM | POA: Diagnosis not present

## 2015-11-08 DIAGNOSIS — J45909 Unspecified asthma, uncomplicated: Secondary | ICD-10-CM | POA: Diagnosis not present

## 2015-11-08 DIAGNOSIS — R7989 Other specified abnormal findings of blood chemistry: Secondary | ICD-10-CM | POA: Diagnosis not present

## 2015-11-08 DIAGNOSIS — K8020 Calculus of gallbladder without cholecystitis without obstruction: Secondary | ICD-10-CM | POA: Diagnosis not present

## 2015-11-08 DIAGNOSIS — E559 Vitamin D deficiency, unspecified: Secondary | ICD-10-CM

## 2015-11-08 DIAGNOSIS — R945 Abnormal results of liver function studies: Secondary | ICD-10-CM

## 2015-11-08 DIAGNOSIS — E282 Polycystic ovarian syndrome: Secondary | ICD-10-CM

## 2015-11-08 DIAGNOSIS — R7303 Prediabetes: Secondary | ICD-10-CM

## 2015-11-08 DIAGNOSIS — R5383 Other fatigue: Secondary | ICD-10-CM

## 2015-11-08 DIAGNOSIS — E7800 Pure hypercholesterolemia, unspecified: Secondary | ICD-10-CM

## 2015-11-08 DIAGNOSIS — N911 Secondary amenorrhea: Secondary | ICD-10-CM | POA: Diagnosis not present

## 2015-11-08 LAB — POCT GLYCOSYLATED HEMOGLOBIN (HGB A1C): Hemoglobin A1C: 5.4

## 2015-11-08 NOTE — Patient Instructions (Addendum)
We are going to get you scheduled for the MRI of the kidneys and contact you.  You likely are not getting enough vitamin D from the Thrive supplement.  We can wait and see what your blood tests show.  If very low, you might need another weekly prescription to get the levels up faster; if only slightly low, we can just increase your daily supplement.  1000-2000 IU of Vitamin D3 is the usual recommendation, for long-term maintenance (so taking 1000 IU plus the Thrive would be fine).  Please start a regular exercise routine as planned. Avoid fried/greasy foods to try and prevent symptoms from your known gallstones.   Cholelithiasis Cholelithiasis (also called gallstones) is a form of gallbladder disease in which gallstones form in your gallbladder. The gallbladder is an organ that stores bile made in the liver, which helps digest fats. Gallstones begin as small crystals and slowly grow into stones. Gallstone pain occurs when the gallbladder spasms and a gallstone is blocking the duct. Pain can also occur when a stone passes out of the duct.  RISK FACTORS  Being female.   Having multiple pregnancies. Health care providers sometimes advise removing diseased gallbladders before future pregnancies.   Being obese.  Eating a diet heavy in fried foods and fat.   Being older than 60 years and increasing age.   Prolonged use of medicines containing female hormones.   Having diabetes mellitus.   Rapidly losing weight.   Having a family history of gallstones (heredity).  SYMPTOMS  Nausea.   Vomiting.  Abdominal pain.   Yellowing of the skin (jaundice).   Sudden pain. It may persist from several minutes to several hours.  Fever.   Tenderness to the touch. In some cases, when gallstones do not move into the bile duct, people have no pain or symptoms. These are called "silent" gallstones.  TREATMENT Silent gallstones do not need treatment. In severe cases, emergency surgery  may be required. Options for treatment include:  Surgery to remove the gallbladder. This is the most common treatment.  Medicines. These do not always work and may take 6-12 months or more to work.  Shock wave treatment (extracorporeal biliary lithotripsy). In this treatment an ultrasound machine sends shock waves to the gallbladder to break gallstones into smaller pieces that can pass into the intestines or be dissolved by medicine. HOME CARE INSTRUCTIONS   Only take over-the-counter or prescription medicines for pain, discomfort, or fever as directed by your health care provider.   Follow a low-fat diet until seen again by your health care provider. Fat causes the gallbladder to contract, which can result in pain.   Follow up with your health care provider as directed. Attacks are almost always recurrent and surgery is usually required for permanent treatment.  SEEK IMMEDIATE MEDICAL CARE IF:   Your pain increases and is not controlled by medicines.   You have a fever or persistent symptoms for more than 2-3 days.   You have a fever and your symptoms suddenly get worse.   You have persistent nausea and vomiting.  MAKE SURE YOU:   Understand these instructions.  Will watch your condition.  Will get help right away if you are not doing well or get worse.   This information is not intended to replace advice given to you by your health care provider. Make sure you discuss any questions you have with your health care provider.   Document Released: 12/15/2004 Document Revised: 08/21/2012 Document Reviewed: 06/12/2012 Elsevier Interactive Patient  Education 2016 [employer].

## 2015-11-10 ENCOUNTER — Other Ambulatory Visit: Payer: Self-pay | Admitting: *Deleted

## 2015-11-10 ENCOUNTER — Telehealth: Payer: Self-pay | Admitting: *Deleted

## 2015-11-10 DIAGNOSIS — N200 Calculus of kidney: Secondary | ICD-10-CM

## 2015-11-10 DIAGNOSIS — N281 Cyst of kidney, acquired: Secondary | ICD-10-CM

## 2015-11-10 NOTE — Telephone Encounter (Signed)
Can you check with radiologist to see if CT would be appropriate for this pt?  If not, then I can do peer to peer (with the understanding that our radiologist didn't feel it was an appropriate substitution).

## 2015-11-10 NOTE — Telephone Encounter (Signed)
Spoke with Dr David [place] (he read the [place] in 2016) he states that the MRI is the preferred for characterization of the cysts. But if you cannot get the PA done, not to stress you can order a CT but it would need to be a Renal or 3 phase CT-they do get more radiation with this though-but he said it would be okay.

## 2015-11-10 NOTE — Telephone Encounter (Signed)
Tried to obtain PA for MR Abdomen w+wo contrast. Per [employer] this meets criteria for CT Abdomen w+wo contrast. Are you willing to change or would you prefer to do a peer to peer to get PA done for the MRI? If you want to do the peer to peer you can call [phone] and use case # [id number].

## 2015-11-11 NOTE — Telephone Encounter (Signed)
Authorization # [id number]  Valid for 45 days.  That took 20 minutes of my time to get this authorized--please be sure that the patient gets this within the valid time frame!

## 2015-11-12 ENCOUNTER — Other Ambulatory Visit: Payer: 59

## 2015-11-12 DIAGNOSIS — R5383 Other fatigue: Secondary | ICD-10-CM

## 2015-11-12 DIAGNOSIS — E7800 Pure hypercholesterolemia, unspecified: Secondary | ICD-10-CM

## 2015-11-12 DIAGNOSIS — E559 Vitamin D deficiency, unspecified: Secondary | ICD-10-CM

## 2015-11-12 DIAGNOSIS — N911 Secondary amenorrhea: Secondary | ICD-10-CM

## 2015-11-12 LAB — CBC WITH DIFFERENTIAL/PLATELET
BASOS ABS: 90 {cells}/uL (ref 0–200)
Basophils Relative: 1 %
EOS PCT: 3 %
Eosinophils Absolute: 270 cells/uL (ref 15–500)
HCT: 40.1 % (ref 35.0–45.0)
Hemoglobin: 13.6 g/dL (ref 11.7–15.5)
Lymphocytes Relative: 36 %
Lymphs Abs: 3240 cells/uL (ref 850–3900)
MCH: 29.1 pg (ref 27.0–33.0)
MCHC: 33.9 g/dL (ref 32.0–36.0)
MCV: 85.9 fL (ref 80.0–100.0)
MONOS PCT: 9 %
MPV: 11 fL (ref 7.5–12.5)
Monocytes Absolute: 810 cells/uL (ref 200–950)
NEUTROS PCT: 51 %
Neutro Abs: 4590 cells/uL (ref 1500–7800)
PLATELETS: 317 10*3/uL (ref 140–400)
RBC: 4.67 MIL/uL (ref 3.80–5.10)
RDW: 13.3 % (ref 11.0–15.0)
WBC: 9 10*3/uL (ref 4.0–10.5)

## 2015-11-12 LAB — TSH: TSH: 1.29 mIU/L

## 2015-11-13 LAB — COMPREHENSIVE METABOLIC PANEL
ALK PHOS: 63 U/L (ref 33–115)
ALT: 13 U/L (ref 6–29)
AST: 15 U/L (ref 10–30)
Albumin: 4.2 g/dL (ref 3.6–5.1)
BILIRUBIN TOTAL: 0.4 mg/dL (ref 0.2–1.2)
BUN: 8 mg/dL (ref 7–25)
CALCIUM: 9.3 mg/dL (ref 8.6–10.2)
CO2: 16 mmol/L — ABNORMAL LOW (ref 20–31)
Chloride: 103 mmol/L (ref 98–110)
Creat: 0.74 mg/dL (ref 0.50–1.10)
GLUCOSE: 92 mg/dL (ref 65–99)
Potassium: 3.8 mmol/L (ref 3.5–5.3)
Sodium: 138 mmol/L (ref 135–146)
TOTAL PROTEIN: 7.3 g/dL (ref 6.1–8.1)

## 2015-11-13 LAB — LIPID PANEL
CHOL/HDL RATIO: 4.6 ratio (ref <5.0)
CHOLESTEROL: 198 mg/dL (ref <200)
HDL: 43 mg/dL — AB (ref >50)
LDL Cholesterol: 138 mg/dL — ABNORMAL HIGH (ref <100)
TRIGLYCERIDES: 83 mg/dL (ref <150)
VLDL: 17 mg/dL (ref <30)

## 2015-11-13 LAB — VITAMIN D 25 HYDROXY (VIT D DEFICIENCY, FRACTURES): VIT D 25 HYDROXY: 16 ng/mL — AB (ref 30–100)

## 2015-11-15 ENCOUNTER — Other Ambulatory Visit: Payer: Self-pay | Admitting: *Deleted

## 2015-11-15 MED ORDER — ERGOCALCIFEROL 1.25 MG (50000 UT) PO CAPS: 50000.0000 [IU] | ORAL_CAPSULE | ORAL | 0 refills | Status: DC

## 2015-12-01 IMAGING — US US TRANSVAGINAL NON-OB
1 series · 14 of 25 positions shown · non-contrast
Comparison: None

CLINICAL DATA: Amenorrhea and pelvic pain

EXAM:
TRANSABDOMINAL AND TRANSVAGINAL ULTRASOUND OF PELVIS
TECHNIQUE: Study was performed transabdominally to optimize pelvic field of
view evaluation and transvaginally to optimize internal visceral
architecture evaluation.

[Series 1: us transvaginal non-ob · 0.25mm/px · 14 of 56 slices shown]
[im 1/56]
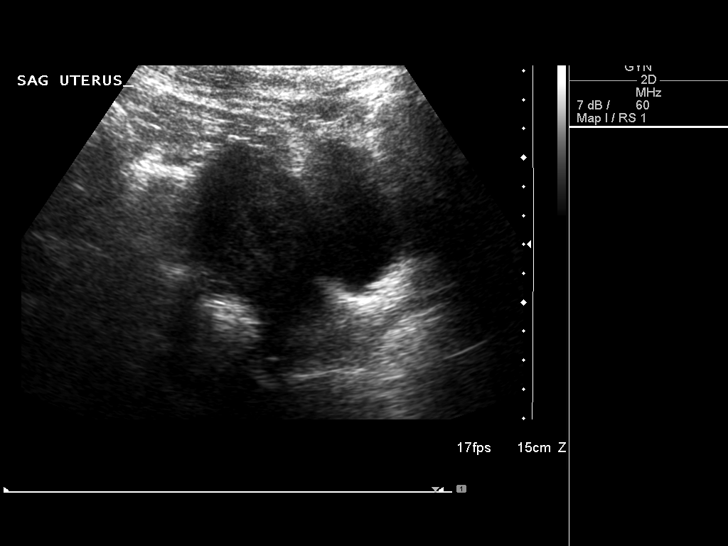
[im 5/56]
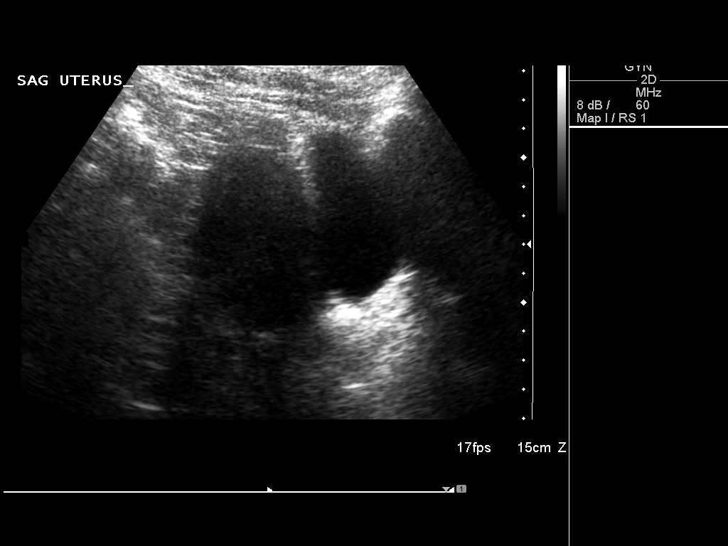
[im 10/56]
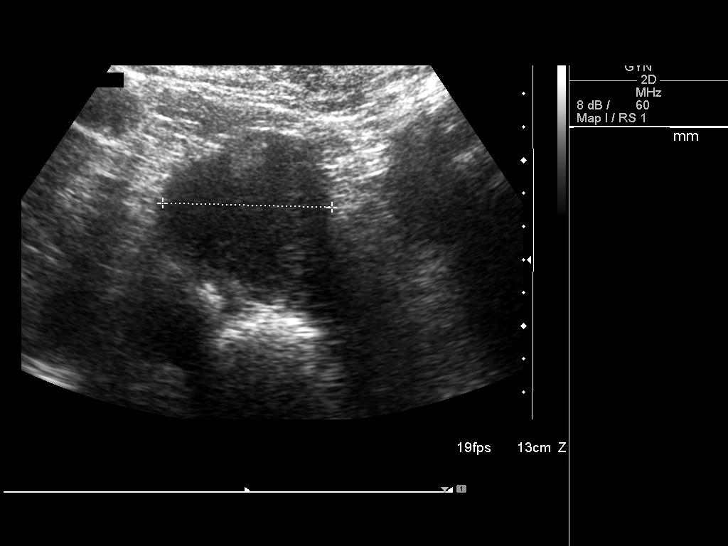
[im 14/56]
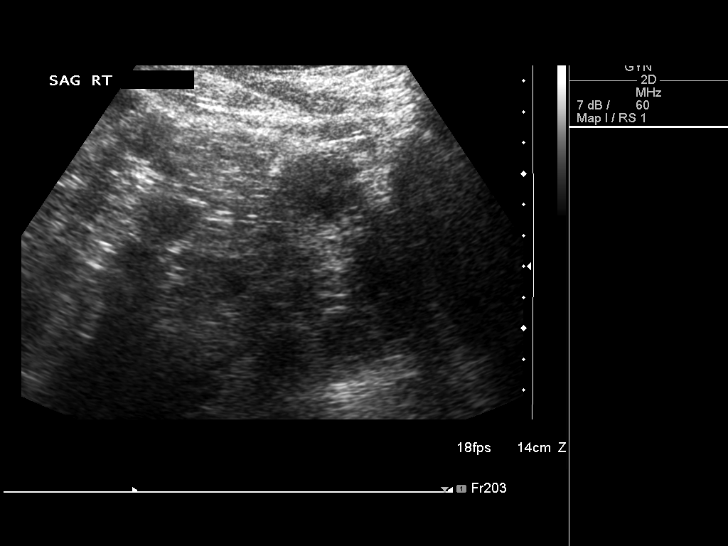
[im 19/56]
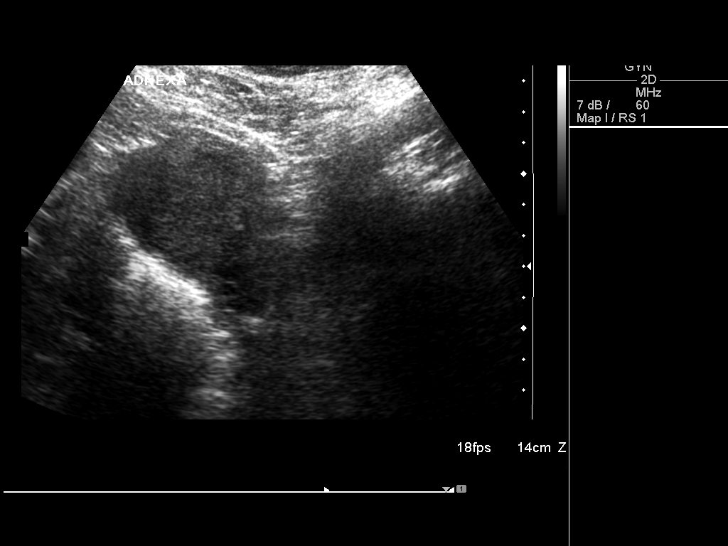
[im 21/56]
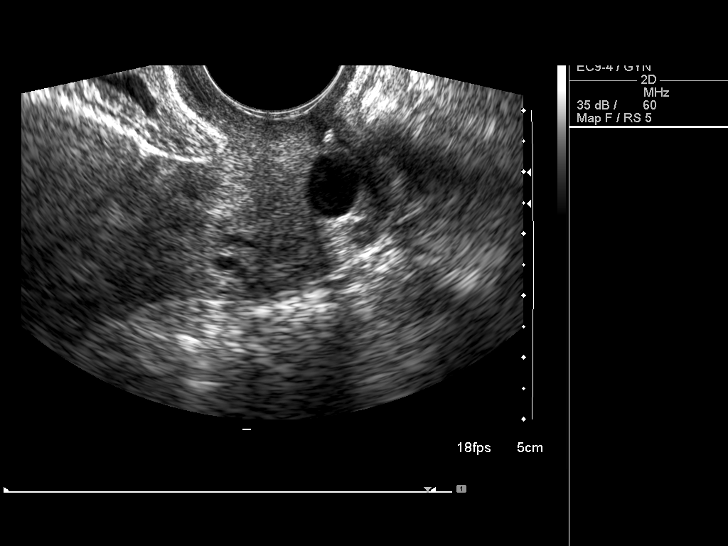
[im 26/56]
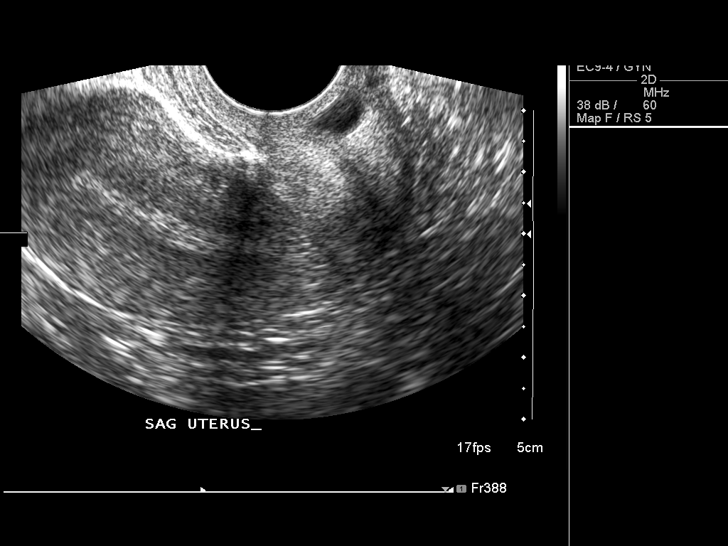
[im 30/56]
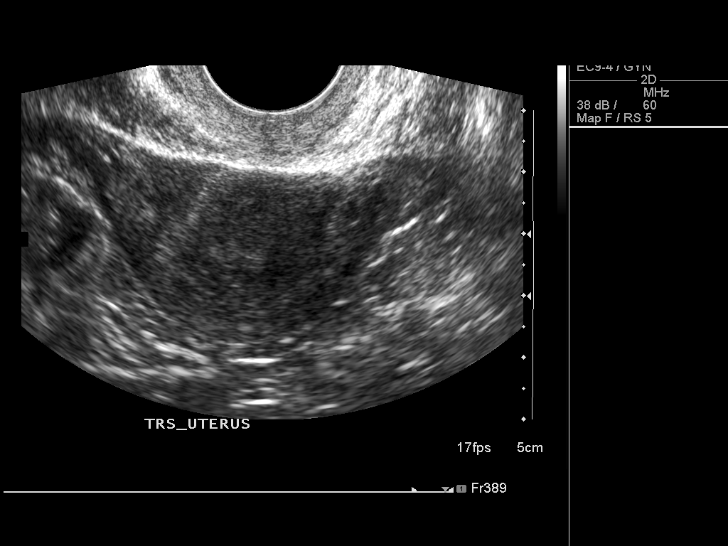
[im 35/56]
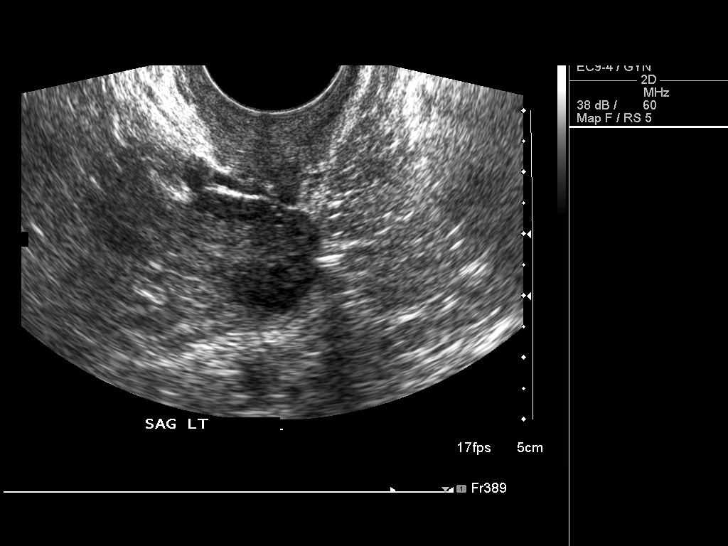
[im 37/56]
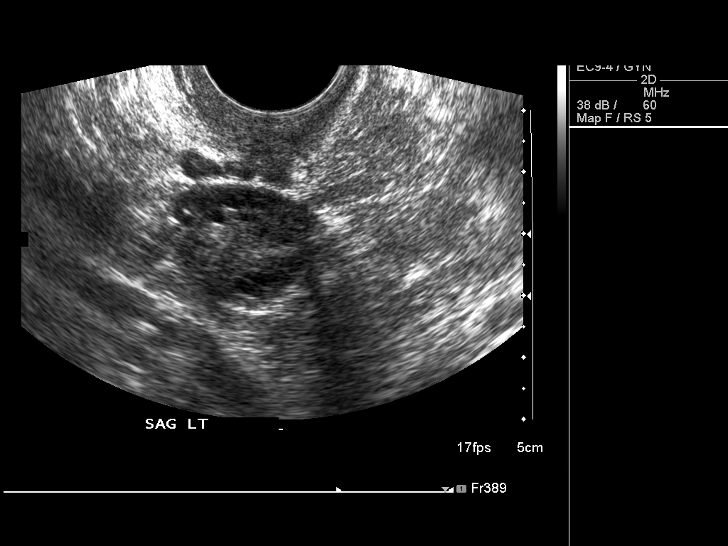
[im 42/56]
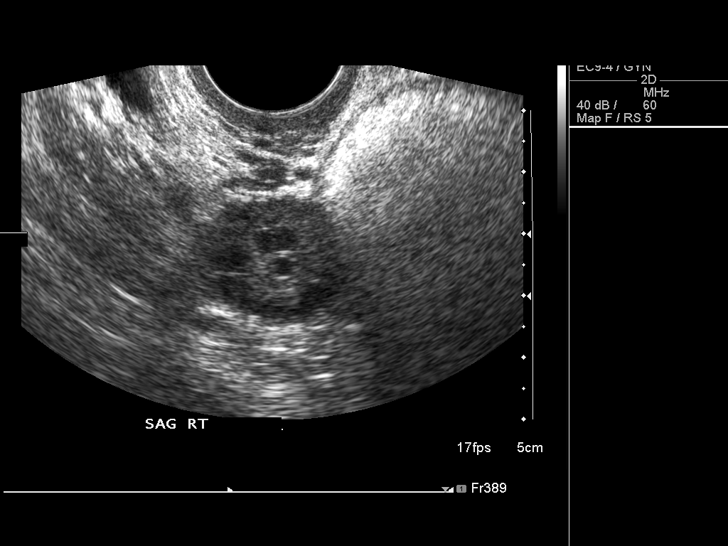
[im 46/56]
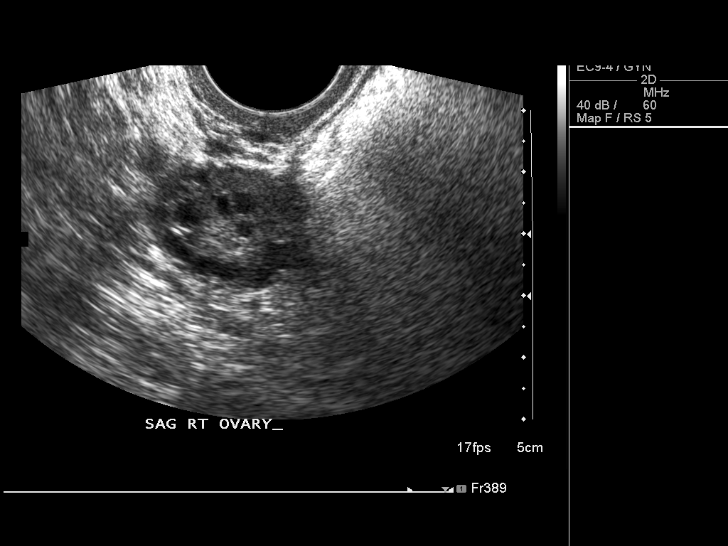
[im 51/56]
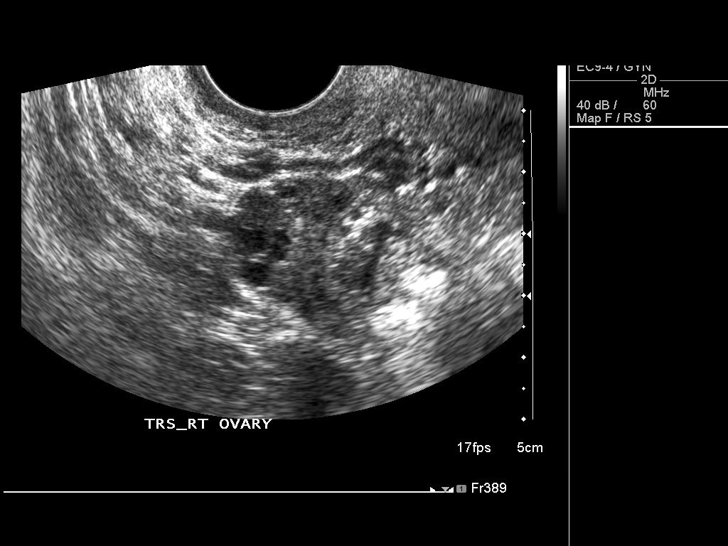
[im 56/56]
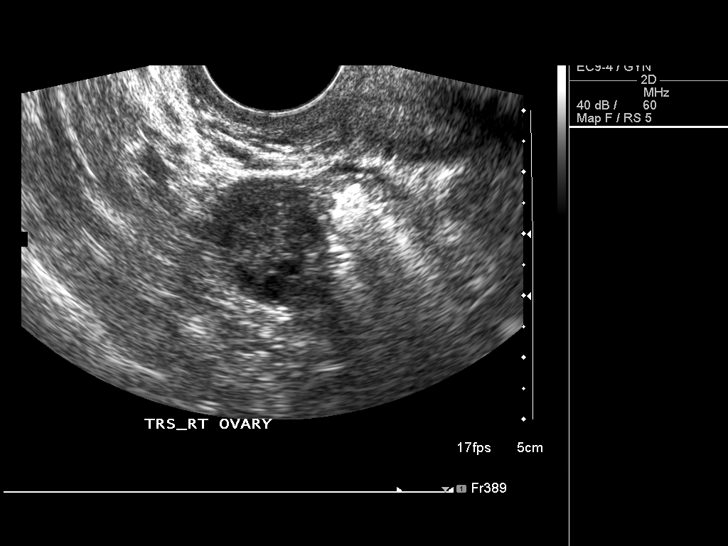

[14 of 25 positions shown; findings below may reference images not displayed]

FINDINGS: Uterus

Measurements: 9.3 x 3.6 x 5.1 cm. No fibroids or other mass
visualized.

Endometrium

Thickness: 4 mm.  No focal abnormality visualized.

Right ovary

Measurements: 3.0 x 2.4 x 2.4 cm. Normal appearance/no adnexal mass.
There are multiple follicles throughout the right ovary, all less
than 8 mm in size.

Left ovary

Measurements: 2.6 x 2.2 x 1.9 cm. Normal appearance/no adnexal mass.
There are multiple follicles throughout the left ovary, less than 8
mm in size.

Other findings

No free fluid.
IMPRESSION: Multiple, approximately 15, small follicles noted in each ovary.
This finding potentially could be indicative of a degree of
polycystic ovary syndrome. No dominant ovarian mass is seen. Neither
ovary is enlarged.

Study otherwise unremarkable.

## 2015-12-01 IMAGING — US US ABDOMEN COMPLETE
1 series · 13 of 25 positions shown · non-contrast
Comparison: None.

CLINICAL DATA: Elevated liver function studies, microscopic
hematuria, no abdominal pain.

EXAM:
ULTRASOUND ABDOMEN COMPLETE

[Series 1: us abdomen complete · 0.24mm/px · 13 of 79 slices shown]
[im 1/79]
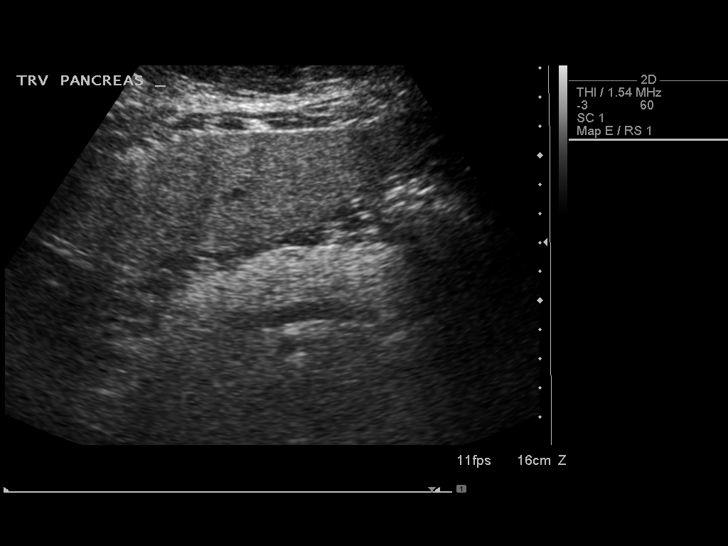
[im 7/79]
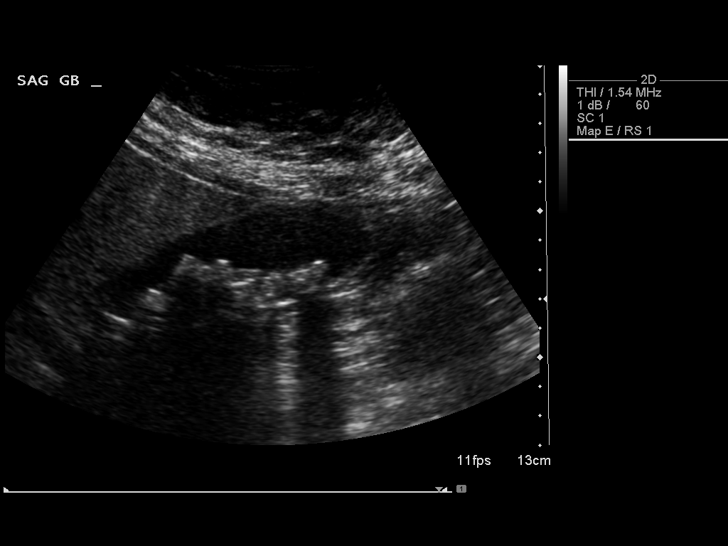
[im 14/79]
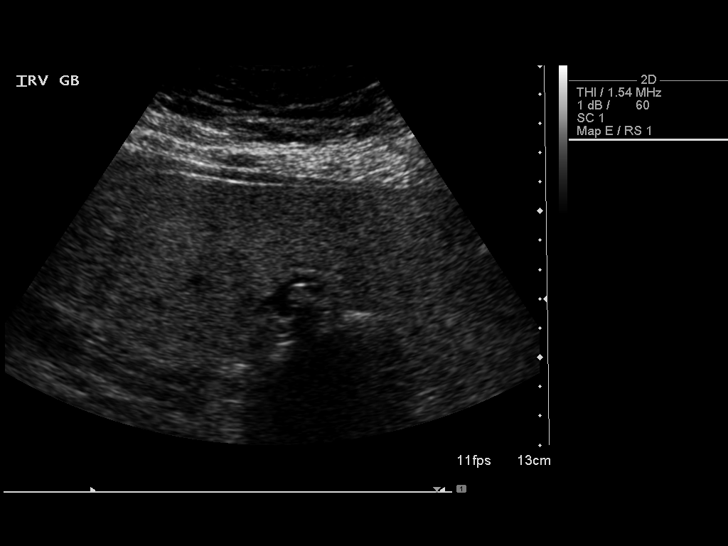
[im 20/79]
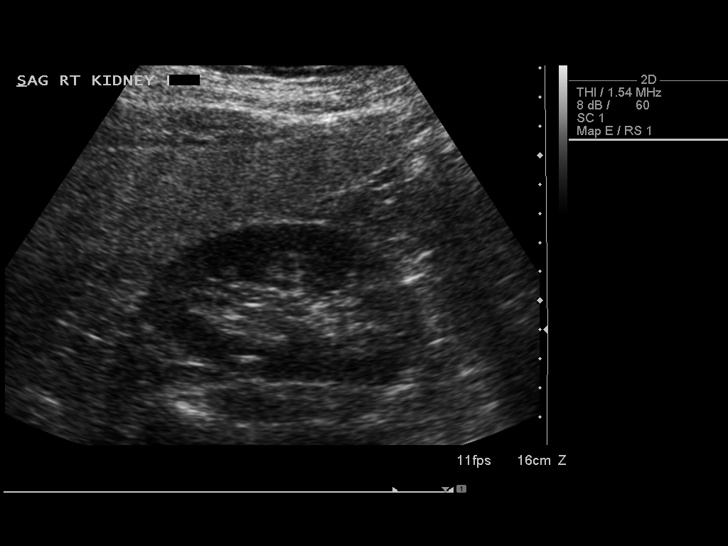
[im 27/79]
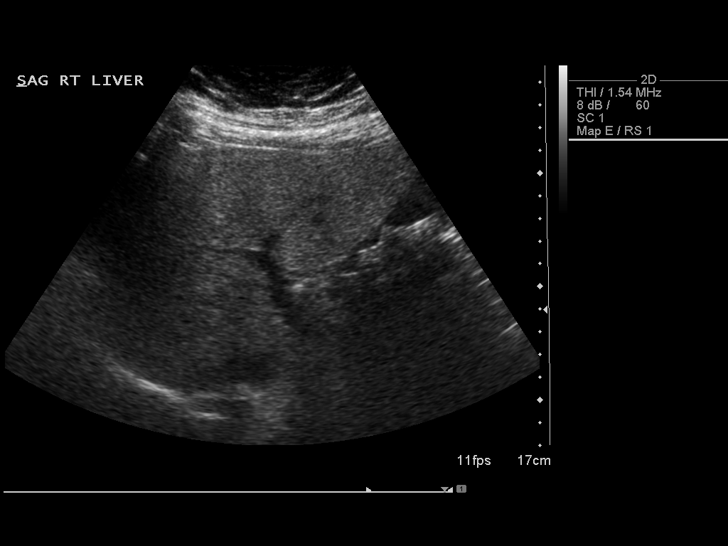
[im 33/79]
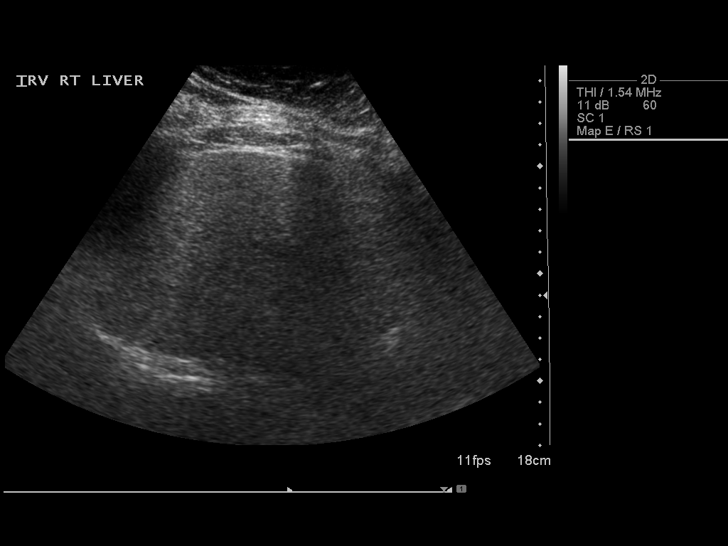
[im 40/79]
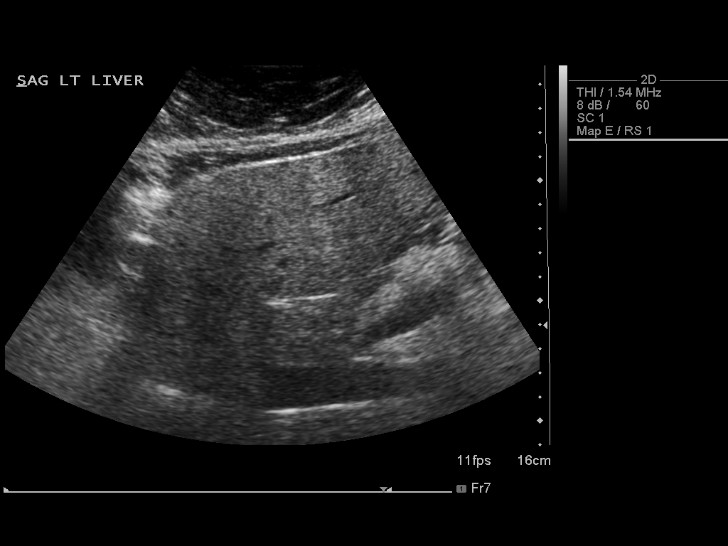
[im 46/79]
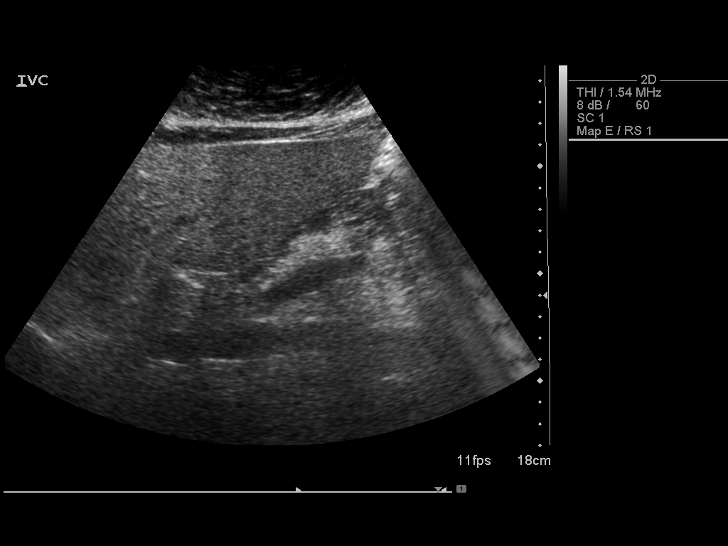
[im 53/79]
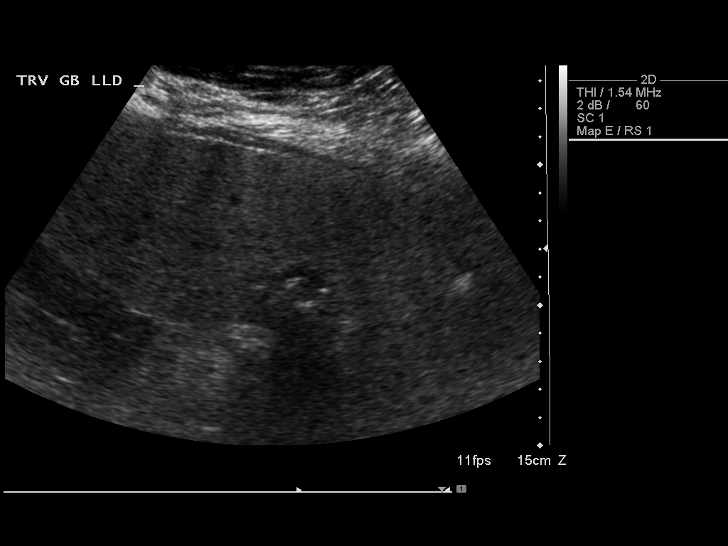
[im 59/79]
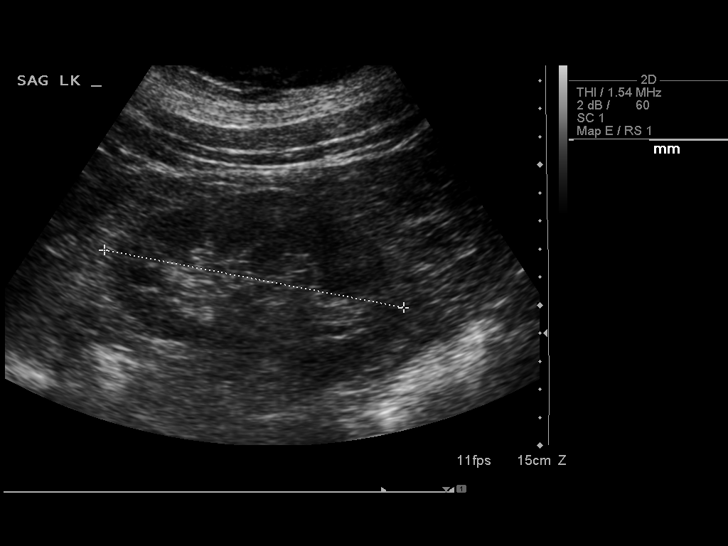
[im 66/79]
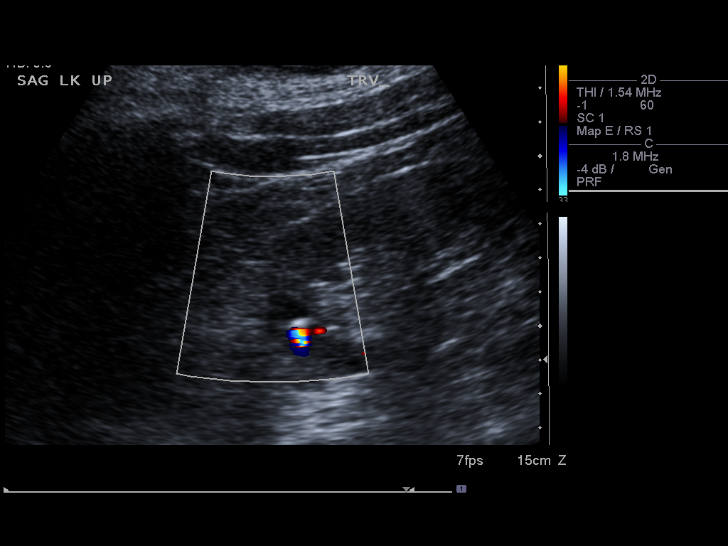
[im 72/79]
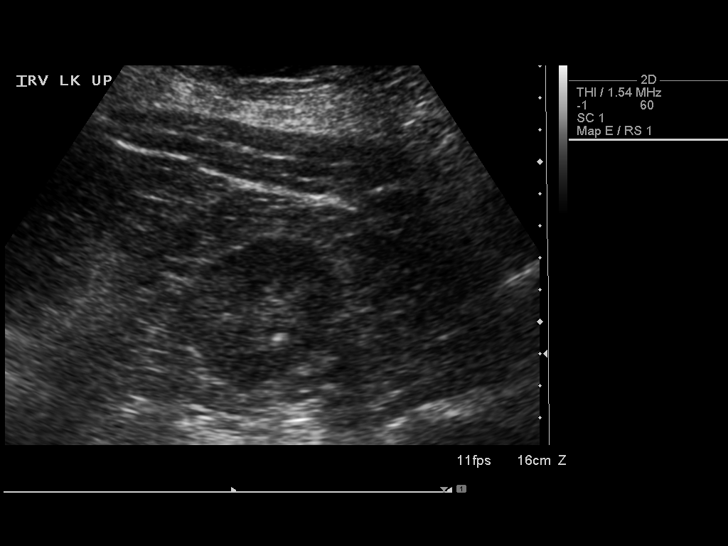
[im 79/79]
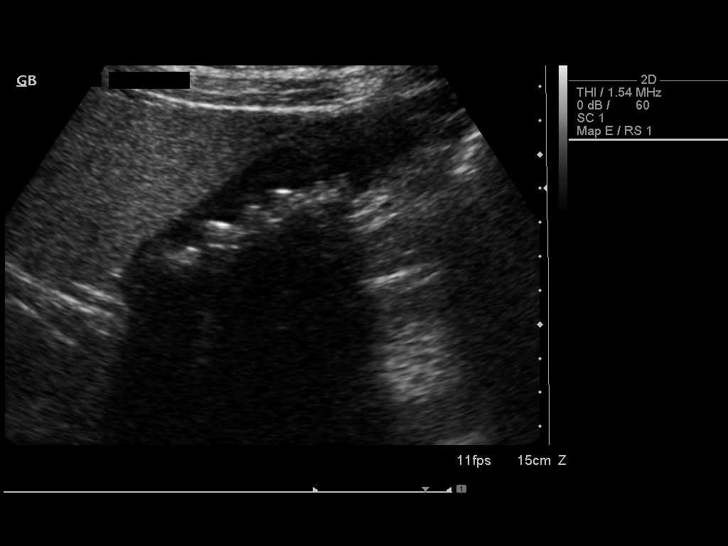

[13 of 25 positions shown; findings below may reference images not displayed]

FINDINGS: Gallbladder: The gallbladder is adequately distended. There are
multiple echogenic mobile shadowing stones. There is no gallbladder
wall thickening, pericholecystic fluid, or positive sonographic
Murphy's sign.

Common bile duct: Diameter: 2.3 mm

Liver: The hepatic echotexture is heterogeneous. There is no focal
mass or ductal dilation.

IVC: No abnormality visualized.

Pancreas: Evaluation of the pancreas was limited by bowel gas. The
visualized portions of the pancreatic body are unremarkable.

Spleen: Size and appearance within normal limits.

Right Kidney: Length: 10.9 cm. Echogenicity within normal limits. No
mass or hydronephrosis visualized.

Left Kidney: Length: 10.9 cm. In the upper pole of the left kidney
there is a 1.2 x 0.8 x 1 cm diameter hypoechoic to anechoic
structure. There is mural calcification. There are small
nonobstructing stones elsewhere in the left kidney. There is no
hydronephrosis..

Abdominal aorta: No aneurysm visualized.

Other findings: None.
IMPRESSION: 1. Gallstones without sonographic evidence of acute cholecystitis.
The hepatic echotexture is heterogeneous without acute abnormality.
2. There is an upper pole cyst in the left kidney with mural
calcification measuring 1.2 cm in diameter. In addition there are
nonobstructing stones in the left kidney. The right kidney is
unremarkable. Renal protocol MRI is recommended.

## 2015-12-03 ENCOUNTER — Other Ambulatory Visit: Payer: 59

## 2016-02-06 ENCOUNTER — Other Ambulatory Visit: Payer: Self-pay | Admitting: Family Medicine

## 2016-03-22 ENCOUNTER — Telehealth: Payer: Self-pay | Admitting: *Deleted

## 2016-03-22 NOTE — Telephone Encounter (Signed)
Called patient to let her know that Dr.Knapp received her labs and that her chol has worsened. She is scheduled for CPE 04/27/16, Dr.Knapp wanted me to offer her a sooner appt to discuss the chol. Patient states that her insurance is not very good and she has to pay a lot out of pocket for each visit and would prefer to address at CPE.

## 2016-04-19 ENCOUNTER — Encounter: Payer: Self-pay | Admitting: Family Medicine

## 2016-04-27 ENCOUNTER — Encounter: Payer: 59 | Admitting: Family Medicine

## 2017-07-31 NOTE — Progress Notes (Signed)
Chief Complaint  Patient presents with  . Weight Loss    would like to discuss getting on phentermine.    Patient presents to discuss weight loss, asking about phentermine.  She hasn't been seen here since 11/2015.  At that time, she had lost weight using Thrive supplements, took it for over a year, lost weight, but stopped due to cost. She has had a lot going on recently--had a job change, was working nights, sleeps during the day.  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 She started drinking [employer]eps[employer] again recently.  No sweet tea, juices. No regular exercise.  She has h/o amenorrhea, suspected PCOS. She was prescribed metformin in 05/2014 (A1c was 6.2). She filled it, but threw it away when she thought it was old.  She never took the prescription. She really doesn't want to, because she would have to take it "forever".    Her periods are now regular, every month.  She had recent appointment with GYN (changed to Dr. Ronita Hipps).  She had pap smear, and brings in copies of a letter with her lab results.  Labs through GYN: TC 202, TG 111, HDL 47, LDL 133 TSH 2.270 CBC normal Prolactin level normal (done for nipple discharge; discharge resolved; still needs to schedule mammogram).  Vitamin D deficiency: Diagnosed in 2013.  Last checked 11/2015 and was low at 16.  At that time she had been taking Thrive vitamins--gets total of 400 IU between the two capsules she takes. No longer taking Thrive, and not taking any vitamins/supplements.  H/o Elevated LFT's and microscopic hematuria--Ultrasound was performed 03/2014 which showed: IMPRESSION: 1. Gallstones without sonographic evidence of acute cholecystitis. The hepatic echotexture is heterogeneous without acute abnormality. 2. There is an upper pole cyst in the left kidney with mural calcification measuring 1.2 cm in diameter. In addition there are nonobstructing stones in the left kidney.  The right kidney is unremarkable. Renal protocol MRI is recommended.  MRI was ordered twice, but she never had it done (canceled the one scheduled for 12/2015). She reports that she has passed 3-4 kidney stones, never had symptoms related to gallstones. Never saw doctor for kidney stones, but has seen them in the toilet and gets immediate relief of the left sided pain she was having.  Hypercholesterolemia: See above for recent labs from GYN.  Prior to this, last checked through wellness screen at work, 03/17/16: Total chol 226, TG 97, HDL 51, LDL 154, chol/HDL ratio 4.4, glu 94.  She was asked to come in to discuss, as this was worse than on her prior check here: Lab Results  Component Value Date   CHOL 198 11/12/2015   HDL 43 (L) 11/12/2015   LDLCALC 138 (H) 11/12/2015   TRIG 83 11/12/2015   CHOLHDL 4.6 11/12/2015   She declined visit, preferred to wait until her CPE that was scheduled for 04/2016, but she cancelled that and hasn't been seen since.   PMH, PSH, SH reviewed. FH updated--father had CABG at age 56.  Reports he took metformin at one point, doesn't believe he had DM. Was stopped when sugars were low (at cardiac rehab).  Meds:  No vitamins or medications recently  No Known Allergies  ROS:  Weight gain.  Denies any significant headaches (rare/mild), no vision/hearing changes, URI symptoms, chest pain, shortness of breath, cough, nausea, vomiting, heartburn, bowel changes, bleeding, bruising, rashes, urinary complaints.  Denies joint pains. +fatigue. Denies depression, but has had +  stress. See HPI    PHYSICAL EXAM:  BP (!) 170/110   Pulse 88   Ht 5' 1"  (1.549 m)   Wt (!) 311 lb 12.8 oz (141.4 kg)   LMP 07/12/2017   BMI 58.91 kg/m    130/80 on repeat with thigh cuff (original cuff was too small)  Wt Readings from Last 3 Encounters:  08/01/17 (!) 311 lb 12.8 oz (141.4 kg)  11/08/15 255 lb (115.7 kg)  05/25/14 (!) 313 lb (142 kg)   Well appearing, pleasant,  morbidly obese female, in good spirits, accompanied by her daughter HEENT: conjunctiva and sclera are clear, anicteric, EOMI, OP clear Neck: no lymphadenopathy, thyromegaly or mass Heart: regular rate and rhythm, no murmur Lungs: clear bilaterally Abdomen: morbidly obese, soft, nontender Extremities: no edema, normal pulses Skin: normal turgor, no rashes Psych: normal mood, affect, hygiene and grooming Neuro: alert and oriented, cranial nerves intact, normal gait  Lab Results  Component Value Date   HGBA1C 6.4 (A) 08/01/2017    ASSESSMENT/PLAN:  Vitamin D deficiency - suspect will be very low again, not taking supplements; may contribute to fatigue - Plan: VITAMIN D 25 Hydroxy (Vit-D Deficiency, Fractures)  Elevated blood pressure reading - improved on recheck. Encouraged regular exercise, low sodium diet and weight loss. NV for BP check since she cannot check elsewhere due to arm size - Plan: Comprehensive metabolic panel  BMI 09.3-11.2, adult (HCC) - counseled extensively re: risks of obesity, healthy diet--cut back carbs portions, meal prep, pack lunches, daily exercise. Start Belviq - Plan: Comprehensive metabolic panel, HgB T6K, Lorcaserin HCl (BELVIQ) 10 MG TABS  Prediabetes - daily exercise, weight loss, cut back on sweets and carbs. Start metformin, increase to 574m BID. Risks/side effects reviewed - Plan: metFORMIN (GLUCOPHAGE) 500 MG tablet   Vit D, c-met, A1c CBC, lipids, TSH--done by GYN  Willing to take metformin only if takes something else for weight loss   F/u 3 months Needs CPE scheduled also  40 min visit, more than 1/2 spent counseling.   Start Belviq--this may require prior auth from your insurance, or it may not be covered.  You can check with your insurance, as well as checking online (ie [email or web address] to see if there are coupons or copay cards.   Check with your insurance to see if they have any weight loss programs (ie nutritionist)

## 2017-08-01 ENCOUNTER — Ambulatory Visit (INDEPENDENT_AMBULATORY_CARE_PROVIDER_SITE_OTHER): Payer: Managed Care, Other (non HMO) | Admitting: Family Medicine

## 2017-08-01 ENCOUNTER — Encounter: Payer: Self-pay | Admitting: Family Medicine

## 2017-08-01 VITALS — BP 130/80 | HR 88 | Ht 61.0 in | Wt 311.8 lb

## 2017-08-01 DIAGNOSIS — R7303 Prediabetes: Secondary | ICD-10-CM

## 2017-08-01 DIAGNOSIS — E559 Vitamin D deficiency, unspecified: Secondary | ICD-10-CM

## 2017-08-01 DIAGNOSIS — R030 Elevated blood-pressure reading, without diagnosis of hypertension: Secondary | ICD-10-CM

## 2017-08-01 DIAGNOSIS — Z6841 Body Mass Index (BMI) 40.0 and over, adult: Secondary | ICD-10-CM | POA: Diagnosis not present

## 2017-08-01 LAB — POCT GLYCOSYLATED HEMOGLOBIN (HGB A1C): HEMOGLOBIN A1C: 6.4 % — AB (ref 4.0–5.6)

## 2017-08-01 MED ORDER — LORCASERIN HCL 10 MG PO TABS: 10.0000 mg | ORAL_TABLET | Freq: Two times a day (BID) | ORAL | 2 refills | Status: AC

## 2017-08-01 MED ORDER — METFORMIN HCL 500 MG PO TABS: ORAL_TABLET | ORAL | 3 refills | Status: DC

## 2017-08-01 NOTE — Patient Instructions (Addendum)
We discussed exercise (150 minutes/week, even if in short intervals of 10-15 minutes at a time). We discussed cutting back on carbs, meal planning--cooking on weekends, prepping and packing lunches, walking at lunch, etc. Drink more water, cut out soda. I'm not a fan of smoothies--they have a lot of sugar.  Eat lots fruits and vegetables, limit carbs and other sugars/sweets.  Start Metformin 500mg --take 1 tablet once daily for a week and if tolerated without diarrhea, then increase to 1 pill twice daily. If you develop any diarrhea, take some metamucil along with it. If you cannot tolerate 2/day, then stay at just 1/day.  Wait at least 1-2 weeks after starting the metformin before starting Belviq, so you know which medicine causes side effects.  Start Belviq--this may require prior auth from your insurance, or it may not be covered.  You can check with your insurance, as well as checking online (ie [email or web address]) to see if there are coupons or copay cards.   Check with your insurance to see if they have any weight loss programs (ie nutritionist)  Your blood pressure was high today.  Work on daily exercise, weight loss, and low sodium diet Return in 2-4 weeks for a blood pressure check.  If it remains >140/90 then we will need to start medications sooner, rather than waiting 3 months.   Prediabetes Prediabetes is the condition of having a blood sugar (blood glucose) level that is higher than it should be, but not high enough for you to be diagnosed with type 2 diabetes. Having prediabetes puts you at risk for developing type 2 diabetes (type 2 diabetes mellitus). Prediabetes may be called impaired glucose tolerance or impaired fasting glucose. Prediabetes usually does not cause symptoms. Your health care provider can diagnose this condition with blood tests. You may be tested for prediabetes if you are overweight and if you have at least one other risk factor for prediabetes. Risk factors for  prediabetes include:  Having a family member with type 2 diabetes.  Being overweight or obese.  Being older than age 62.  Being of American-Indian, African-American, Hispanic/Latino, or Asian/Pacific Islander descent.  Having an inactive (sedentary) lifestyle.  Having a history of gestational diabetes or polycystic ovarian syndrome (PCOS).  Having low levels of good cholesterol (HDL-C) or high levels of blood fats (triglycerides).  Having high blood pressure.  What is blood glucose and how is blood glucose measured?  Blood glucose refers to the amount of glucose in your bloodstream. Glucose comes from eating foods that contain sugars and starches (carbohydrates) that the body breaks down into glucose. Your blood glucose level may be measured in mg/dL (milligrams per deciliter) or mmol/L (millimoles per liter).Your blood glucose may be checked with one or more of the following blood tests:  A fasting blood glucose (FBG) test. You will not be allowed to eat (you will fast) for at least 8 hours before a blood sample is taken. ? A normal range for FBG is 70-100 mg/dl (0.9-8.1 mmol/L).  An A1c (hemoglobin A1c) blood test. This test provides information about blood glucose control over the previous 2?3months.  An oral glucose tolerance test (OGTT). This test measures your blood glucose twice: ? After fasting. This is your baseline level. ? Two hours after you drink a beverage that contains glucose.  You may be diagnosed with prediabetes:  If your FBG is 100?125 mg/dL (1.9-1.4 mmol/L).  If your A1c level is 5.7?6.4%.  If your OGGT result is 140?199 mg/dL (7.8-29 mmol/L).  These  blood tests may be repeated to confirm your diagnosis. What happens if blood glucose is too high? The pancreas produces a hormone (insulin) that helps move glucose from the bloodstream into cells. When cells in the body do not respond properly to insulin that the body makes (insulin resistance), excess  glucose builds up in the blood instead of going into cells. As a result, high blood glucose (hyperglycemia) can develop, which can cause many complications. This is a symptom of prediabetes. What can happen if blood glucose stays higher than normal for a long time? Having high blood glucose for a long time is dangerous. Too much glucose in your blood can damage your nerves and blood vessels. Long-term damage can lead to complications from diabetes, which may include:  Heart disease.  Stroke.  Blindness.  Kidney disease.  Depression.  Poor circulation in the feet and legs, which could lead to surgical removal (amputation) in severe cases.  How can prediabetes be prevented from turning into type 2 diabetes?  To help prevent type 2 diabetes, take the following actions:  Be physically active. ? Do moderate-intensity physical activity for at least 30 minutes on at least 5 days of the week, or as much as told by your health care provider. This could be brisk walking, biking, or water aerobics. ? Ask your health care provider what activities are safe for you. A mix of physical activities may be best, such as walking, swimming, cycling, and strength training.  Lose weight as told by your health care provider. ? Losing 5-7% of your body weight can reverse insulin resistance. ? Your health care provider can determine how much weight loss is best for you and can help you lose weight safely.  Follow a healthy meal plan. This includes eating lean proteins, complex carbohydrates, fresh fruits and vegetables, low-fat dairy products, and healthy fats. ? Follow instructions from your health care provider about eating or drinking restrictions. ? Make an appointment to see a diet and nutrition specialist (registered dietitian) to help you create a healthy eating plan that is right for you.  Do not smoke or use any tobacco products, such as cigarettes, chewing tobacco, and e-cigarettes. If you need help  quitting, ask your health care provider.  Take over-the-counter and prescription medicines as told by your health care provider. You may be prescribed medicines that help lower the risk of type 2 diabetes.  This information is not intended to replace advice given to you by your health care provider. Make sure you discuss any questions you have with your health care provider. Document Released: 04/12/2015 Document Revised: 05/27/2015 Document Reviewed: 02/09/2015 Elsevier Interactive Patient Education  [employer]2018 [employer]

## 2017-08-02 ENCOUNTER — Other Ambulatory Visit: Payer: Self-pay | Admitting: *Deleted

## 2017-08-02 LAB — COMPREHENSIVE METABOLIC PANEL
ALBUMIN: 4.3 g/dL (ref 3.5–5.5)
ALK PHOS: 69 IU/L (ref 39–117)
ALT: 109 IU/L — ABNORMAL HIGH (ref 0–32)
AST: 94 IU/L — AB (ref 0–40)
Albumin/Globulin Ratio: 1.4 (ref 1.2–2.2)
BILIRUBIN TOTAL: 0.5 mg/dL (ref 0.0–1.2)
BUN / CREAT RATIO: 10 (ref 9–23)
BUN: 7 mg/dL (ref 6–24)
CHLORIDE: 99 mmol/L (ref 96–106)
CO2: 23 mmol/L (ref 20–29)
Calcium: 9.3 mg/dL (ref 8.7–10.2)
Creatinine, Ser: 0.68 mg/dL (ref 0.57–1.00)
GFR calc Af Amer: 124 mL/min/{1.73_m2} (ref >59)
GFR calc non Af Amer: 107 mL/min/{1.73_m2} (ref >59)
GLOBULIN, TOTAL: 3.1 g/dL (ref 1.5–4.5)
GLUCOSE: 71 mg/dL (ref 65–99)
Potassium: 4.2 mmol/L (ref 3.5–5.2)
SODIUM: 141 mmol/L (ref 134–144)
Total Protein: 7.4 g/dL (ref 6.0–8.5)

## 2017-08-02 LAB — VITAMIN D 25 HYDROXY (VIT D DEFICIENCY, FRACTURES): Vit D, 25-Hydroxy: 12.2 ng/mL — ABNORMAL LOW (ref 30.0–100.0)

## 2017-08-02 MED ORDER — ERGOCALCIFEROL 1.25 MG (50000 UT) PO CAPS: 50000.0000 [IU] | ORAL_CAPSULE | ORAL | 0 refills | Status: DC

## 2017-08-08 ENCOUNTER — Encounter: Payer: Self-pay | Admitting: Family Medicine

## 2017-08-17 ENCOUNTER — Other Ambulatory Visit: Payer: Self-pay

## 2017-10-25 ENCOUNTER — Encounter: Payer: Self-pay | Admitting: Family Medicine

## 2017-10-25 ENCOUNTER — Ambulatory Visit: Payer: Managed Care, Other (non HMO) | Admitting: Family Medicine

## 2017-10-25 VITALS — BP 140/90 | HR 84 | Temp 99.9°F | Ht 61.0 in | Wt 315.0 lb

## 2017-10-25 DIAGNOSIS — J0100 Acute maxillary sinusitis, unspecified: Secondary | ICD-10-CM | POA: Diagnosis not present

## 2017-10-25 DIAGNOSIS — R059 Cough, unspecified: Secondary | ICD-10-CM

## 2017-10-25 DIAGNOSIS — R05 Cough: Secondary | ICD-10-CM | POA: Diagnosis not present

## 2017-10-25 DIAGNOSIS — R7303 Prediabetes: Secondary | ICD-10-CM

## 2017-10-25 MED ORDER — AMOXICILLIN 500 MG PO TABS: 1000.0000 mg | ORAL_TABLET | Freq: Two times a day (BID) | ORAL | 0 refills | Status: AC

## 2017-10-25 MED ORDER — BENZONATATE 200 MG PO CAPS: 200.0000 mg | ORAL_CAPSULE | Freq: Three times a day (TID) | ORAL | 0 refills | Status: AC | PRN

## 2017-10-25 MED ORDER — METFORMIN HCL ER 500 MG PO TB24: 500.0000 mg | ORAL_TABLET | Freq: Every day | ORAL | 2 refills | Status: AC

## 2017-10-25 NOTE — Patient Instructions (Signed)
  Drink plenty of water. Take the antibiotics as directed. Contact us in 5 days or so if you haven't noticed any improvement or if you are worse to have the antibiotic changed. Continue the allergy medications (flonase). Consider trying Robitussin DM (the guaifenesin is an expectorant to loosen the mucus and help with the cough from drainage and chest congestion; the dextromethorphan is a cough suppressant--this is also likely in your dayquil so be sure to look at the ingredients so that you don't duplicate medications; double check your [employer] as well). I'm prescribing benzonatate to help with cough as well.  I'd like to retry Metformin with you for your prediabetes. We will change from the regular metformin to the extended release. You take 1 pill each morning with breakfast.  You can change the time of day, if you prefer to take it with dinner, depending on side effects. We can stay at this lowest dose for now, see how you tolerate it.  If sugars remain elevated, and if you're tolerating it, we can titrate up the dose in the future.

## 2017-10-25 NOTE — Progress Notes (Signed)
Chief Complaint  Patient presents with  . Cough    x 3 weeks with green mucus as well as lots of nasal drainage.    Started with a dry cough 3 weeks ago.  A few days later she was having discolored nasal drainage, head congestion, productive cough.   She has persistent cough.  She still has discolored drainage in the morning, but no longer blowing her nose as much.  She has seasonal allergies--started using Flonase once she got sick. She also uses Wal-Act (actifed, contains pseudoephedrine) and/or Dayquil.  Nothing is helping with her cough.  She is drinking a lot of liquid. Not aware of having any fever, no chills. She presents finally for evaluation, mainly due to her coworkers giving her dirty looks...  She has h/o asthma, which is worse when she gains weight. Denies any significant reflux. Hasn't tried use of an inhaler--doesn't feel like she is wheezy.  She recalls getting welts/rash about 6-7 years ago when she took a liquid mucinex (unsure of which kind); never had issues with robitussin in the past   Pre-diabetes: She took metformin for 2 weeks. She felt shaky "insides were jumping"; stopped taking it and it resolved. Doesn't recall if it was related to not eating or if eating made it feel better. Didn't start until after the first week. Maybe when dose was increased to BID. Lab Results  Component Value Date   HGBA1C 6.4 (A) 08/01/2017   PMH, PSH, SH reviewed  Current Outpatient Medications on File Prior to Visit  Medication Sig Dispense Refill  . fluticasone (FLONASE) 50 MCG/ACT nasal spray Place 1 spray into both nostrils daily.    . Pseudoephedrine-APAP-DM (DAYQUIL PO) Take 2 tablets by mouth daily.    . Lorcaserin HCl (BELVIQ) 10 MG TABS Take 10 mg by mouth 2 (two) times daily. (Patient not taking: Reported on 10/25/2017) 60 tablet 2   No current facility-administered medications on file prior to visit.     ROS: Denies wheezing, shortness of breath, chest pain. Wasn't  aware of fever. No ear pain. No nausea or vomiting.  +diarrhea started 2 days ago, is on her menstrual cycle, and sometimes she gets that.  This has improved (was worse 2 days ago). No bleeding, bruising, rashes.   PHYSICAL EXAM:  BP 140/90   Pulse 84   Temp 99.9 F (37.7 C) (Tympanic)   Ht 5\' 1"  (1.549 m)   Wt (!) 315 lb (142.9 kg)   LMP 10/20/2017   BMI 59.52 kg/m   Wt Readings from Last 3 Encounters:  10/25/17 (!) 315 lb (142.9 kg)  08/01/17 (!) 311 lb 12.8 oz (141.4 kg)  11/08/15 255 lb (115.7 kg)   Well-appearing female, in good spirits, with frequent spells of dry, hacky cough.  Speaking easily, in no distress HEENT: conjunctiva and sclera are clear. TM's and EAC's normal. Nasal mucosa is moderately edematous, very red.  Clear-white mucus noted on the left. OP is clear.  Tender over left maxillary sinus Neck: no lymphadenopathy or mass Heart: regular rate and rhythm Lungs: clear bilaterally. No wheezes, rales, ronchi.  No cough or wheeze with forced expiration. Neuro: alert and oriented, cranial nerves intact, normal gait   ASSESSMENT/PLAN:  Acute non-recurrent maxillary sinusitis - Plan: amoxicillin (AMOXIL) 500 MG tablet  Prediabetes - try changing to metformin ER once daily - Plan: metFORMIN (GLUCOPHAGE-XR) 500 MG 24 hr tablet  Cough - Plan: benzonatate (TESSALON) 200 MG capsule    Drink plenty of water. Take the  antibiotics as directed. Contact us in 5 days or so if you haven't noticed any improvement or if you are worse to have the antibiotic changed. Continue the allergy medications (flonase). Consider trying Robitussin DM (the guaifenesin is an expectorant to loosen the mucus and help with the cough from drainage and chest congestion; the dextromethorphan is a cough suppressant--this is also likely in your dayquil so be sure to look at the ingredients so that you don't duplicate medications; double check your [employer]-Act as well). I'm prescribing benzonatate to help  with cough as well.  I'd like to retry Metformin with you for your prediabetes. We will change from the regular metformin to the extended release. You take 1 pill each morning with breakfast.  You can change the time of day, if you prefer to take it with dinner, depending on side effects. We can stay at this lowest dose for now, see how you tolerate it.  If sugars remain elevated, and if you're tolerating it, we can titrate up the dose in the future.   R/s med check for mid-late January

## 2017-11-01 ENCOUNTER — Ambulatory Visit: Payer: Self-pay | Admitting: Family Medicine

## 2018-01-28 ENCOUNTER — Encounter: Payer: Managed Care, Other (non HMO) | Admitting: Family Medicine

## 2018-03-20 ENCOUNTER — Encounter: Payer: Self-pay | Admitting: Family Medicine

## 2018-10-24 ENCOUNTER — Ambulatory Visit: Payer: 59 | Admitting: Registered"

## 2018-11-04 ENCOUNTER — Ambulatory Visit: Payer: 59 | Admitting: Registered"

## 2018-11-07 ENCOUNTER — Encounter: Payer: Managed Care, Other (non HMO) | Attending: Family Medicine | Admitting: Registered"

## 2018-11-07 ENCOUNTER — Other Ambulatory Visit: Payer: Self-pay

## 2018-11-07 DIAGNOSIS — E119 Type 2 diabetes mellitus without complications: Secondary | ICD-10-CM | POA: Diagnosis not present

## 2018-11-07 NOTE — Progress Notes (Signed)
Diabetes Self-Management Education  Visit Type: First/Initial  Appt. Start Time: 1530 Appt. End Time: 9983   11/07/2018  [name], identified by name and date of birth, is a 43 y.o. female with a diagnosis of Diabetes: Type 2.   ASSESSMENT  There were no vitals taken for this visit. There is no height or weight on file to calculate BMI.   Pt states she would like to improve her overall health, reports she has been improving her blood pressure working with Dr. Kary Kos and has an appointment on 11/20 to update blood work. Pt states she has had pre-diabetes for awhile.  Sleep: 6 1/2 hrs, can fall asleep within 5 min. Pt reports that she snores. Pt states her snoring stopped when she lost weight in the past. Gets sleepy at 11 pm but often stays up until 12, wakes up early to let dog out (puppy got 2 weeks ago).  Energy: 2/10. Pt states energy has improved, had to make herself take care of ADLs. Now is still tired but doesn't feel like she has to work as hard to make herself get things done.  Pt states her diet was awful until recently and felt she knew what changes she needed to make and started cooking more at home rather than eating out and making more of an effort to have breakfast. Pt states eating well is hard because her husband does not like vegetables. Pt states he has T1DM.  Pt states for quite a few months her family was eating out a lot while having work done in kitchen to fix sink issues. Pt is glad to be able to cook in her kitchen again.  Medications: Pt sates she has a prescription for antidepressant d/t father passing recently but has not filled it.  PCOS diagnosed in 2016; confirmed with [place] cysts per Epic. Pt states after pregnancy 10 yrs ago, cycle became more regular. Pt states she has not recently talked to any of her current health care providers about PCOS.   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 Pt states  the school situation with COVID makes it stressful with the changing back and forth with her in-person and virtual school situation.   Diabetes Self-Management Education - 11/07/18 1544      Visit Information   Visit Type  First/Initial      Initial Visit   Diabetes Type  Type 2    Are you currently following a meal plan?  No    Are you taking your medications as prescribed?  No   metformin Rx   Date Diagnosed  2020      Health Coping   How would you rate your overall health?  Fair      Psychosocial Assessment   Patient Belief/Attitude about Diabetes  Motivated to manage diabetes    How often do you need to have someone help you when you read instructions, pamphlets, or other written materials from your doctor or pharmacy?  1 - Never    What is the last grade level you completed in school?  12      Complications   Last HgB A1C per patient/outside source  6.5 %   per referral lab 07/2018   How often do you check your blood sugar?  0 times/day (not testing)    Have you had a dilated eye exam in the past 12 months?  Yes    Have you had  a dental exam in the past 12 months?  No    Are you checking your feet?  No      Dietary Intake   Breakfast  smoothies, frozen fruit, 50/50 OJ, 2 Tbs OR bagel w cream cheese    Snack (morning)  none    Lunch  grilled cheese OR hotdog OR meatballs    Snack (afternoon)  not a lot    Data processing manager or pork, potatoes, vegetables    Beverage(s)  4 16-oz water, 12-oz pepsi 1 per week in one month, less than 1x week sweet tea      Exercise   Exercise Type  Light (walking / raking leaves)    How many days per week to you exercise?  0    How many minutes per day do you exercise?  0    Total minutes per week of exercise  0      Patient Education   Previous Diabetes Education  No    Disease state   Definition of diabetes, type 1 and 2, and the diagnosis of diabetes    Nutrition management   Role of diet in the treatment of diabetes and the  relationship between the three main macronutrients and blood glucose level    Medications  Reviewed patients medication for diabetes, action, purpose, timing of dose and side effects.      Individualized Goals (developed by patient)   Nutrition  General guidelines for healthy choices and portions discussed    Medications  take my medication as prescribed      Outcomes   Expected Outcomes  Demonstrated interest in learning. Expect positive outcomes    Future DMSE  PRN    Program Status  Completed       Individualized Plan for Diabetes Self-Management Training:   Learning Objective:  Patient will have a greater understanding of diabetes self-management. Patient education plan is to attend individual and/or group sessions per assessed needs and concerns.   Patient Instructions  Consider calling your pharmacist and asking if your extended release metformin is okay to take, if so consider taking it as directed. Metformin can help with PCOS as well. I will be sending Dr. Rosemary Holms a note about my thoughts on PCOS.  Aim to eat balanced meals and snacks Pay attention to the carbs and balance with protein. Continue working toward cooking more balanced meals and having left overs for lunch.   Go to sleep when sleepy. Consider having a bedtime routine to help you prepare for sleep.   Expected Outcomes:  Demonstrated interest in learning. Expect positive outcomes  Education material provided: A1C conversion sheet and Meal plan card, sleep hygiene  If problems or questions, patient to contact team via:  Phone and MyChart  Future DSME appointment: PRN

## 2018-11-07 NOTE — Patient Instructions (Addendum)
Consider calling your pharmacist and asking if your extended release metformin is okay to take, if so consider taking it as directed. Metformin can help with PCOS as well. I will be sending Dr. Edwyna Ready a note about my thoughts on PCOS.  Aim to eat balanced meals and snacks Pay attention to the carbs and balance with protein. Continue working toward cooking more balanced meals and having left overs for lunch.   Go to sleep when sleepy. Consider having a bedtime routine to help you prepare for sleep.

## 2019-03-17 ENCOUNTER — Ambulatory Visit: Payer: Managed Care, Other (non HMO)
